# Patient Record
Sex: Male | Born: 1965 | Race: White | Hispanic: No | Marital: Married | Smoking: Current some day smoker
Health system: Western US, Academic
[De-identification: ages and names within clinical notes are randomized; demographics above are authoritative.]

## PROBLEM LIST (undated history)

## (undated) DIAGNOSIS — M1990 Unspecified osteoarthritis, unspecified site: Secondary | ICD-10-CM

## (undated) DIAGNOSIS — R768 Other specified abnormal immunological findings in serum: Secondary | ICD-10-CM

## (undated) DIAGNOSIS — R202 Paresthesia of skin: Secondary | ICD-10-CM

## (undated) DIAGNOSIS — R200 Anesthesia of skin: Secondary | ICD-10-CM

## (undated) DIAGNOSIS — E291 Testicular hypofunction: Secondary | ICD-10-CM

## (undated) DIAGNOSIS — N529 Male erectile dysfunction, unspecified: Secondary | ICD-10-CM

## (undated) DIAGNOSIS — Z8739 Personal history of other diseases of the musculoskeletal system and connective tissue: Secondary | ICD-10-CM

## (undated) DIAGNOSIS — M79606 Pain in leg, unspecified: Secondary | ICD-10-CM

## (undated) DIAGNOSIS — I10 Essential (primary) hypertension: Secondary | ICD-10-CM

## (undated) HISTORY — DX: Male erectile dysfunction, unspecified: N52.9

## (undated) HISTORY — DX: Other specified abnormal immunological findings in serum: R76.8

## (undated) HISTORY — DX: Pain in leg, unspecified: M79.606

## (undated) HISTORY — DX: Testicular hypofunction: E29.1

## (undated) HISTORY — DX: Essential (primary) hypertension: I10

## (undated) HISTORY — DX: Anesthesia of skin: R20.0

## (undated) HISTORY — DX: Unspecified osteoarthritis, unspecified site: M19.90

## (undated) HISTORY — DX: Personal history of other diseases of the musculoskeletal system and connective tissue: Z87.39

## (undated) HISTORY — DX: Paresthesia of skin: R20.2

---

## 2016-06-10 HISTORY — PX: COLONOSCOPY: GILAB00002

## 2016-06-18 DIAGNOSIS — R768 Other specified abnormal immunological findings in serum: Secondary | ICD-10-CM

## 2016-06-18 HISTORY — DX: Other specified abnormal immunological findings in serum: R76.8

## 2016-09-01 ENCOUNTER — Ambulatory Visit: Payer: 59

## 2016-09-01 DIAGNOSIS — B182 Chronic viral hepatitis C: Secondary | ICD-10-CM | POA: Insufficient documentation

## 2016-09-01 LAB — MMC  MANUAL DIFFERENTIAL
Bands %: 5 %
Eosinophils %: 3 %
Eosinophils Abs: 0.2 10*3/uL (ref 0.0–0.2)
Lymphocytes %: 52 %
Lymphocytes Abs: 3.5 10*3/uL — ABNORMAL HIGH (ref 1.3–2.9)
Monocytes %: 12 %
Monocytes Abs: 0.8 10*3/uL (ref 0.3–0.8)
Neutrophil Abs: 2.2 10*3/uL (ref 2.2–4.8)
Polys (Segs) %: 28 %
Sample Size, WBC: 100

## 2016-09-01 LAB — HEPATIC FUNCTION PANEL
Alanine Transferase (ALT): 44 U/L (ref 2–56)
Albumin: 4.3 g/dL (ref 3.2–4.7)
Alkaline Phosphatase (ALP): 44 U/L (ref 38–126)
Aspartate Transaminase (AST): 34 U/L (ref 9–44)
Bilirubin Direct: <0.1 mg/dL (ref <=0.5)
Bilirubin Total: 0.5 mg/dL (ref 0.1–2.2)
Protein: 7.2 g/dL (ref 5.9–8.2)

## 2016-09-01 LAB — CBC WITH DIFFERENTIAL
Hematocrit: 41.9 % (ref 38.0–51.0)
Hemoglobin: 14.6 g/dL (ref 13.0–18.0)
MCH: 31.9 pg (ref 27.0–34.0)
MCHC g/dL: 34.8 g/dL (ref 33.0–37.0)
MCV: 91.7 fL (ref 82.0–99.0)
MPV: 10.6 fL (ref 9.4–12.4)
Platelet Count: 179 10*3/uL (ref 151–365)
RDW: 11.8 % (ref 11.5–14.5)
Red Blood Cell Count: 4.57 10*6/uL (ref 4.10–5.65)
White Blood Cell Count: 6.8 10*3/uL (ref 4.2–10.8)

## 2016-09-05 LAB — MMC HEP C VIRUS BY QUANTITATIVE NAAT SENDOUT
zzzHCV Qnt by PCR (IU/mL): <15 IU/mL
zzzHCV Qnt by PCR (log IU/mL): <1.2 {Log_IU}
zzzHCV Qnt by PCR Interp: NOT DETECTED

## 2016-09-07 ENCOUNTER — Other Ambulatory Visit (HOSPITAL_BASED_OUTPATIENT_CLINIC_OR_DEPARTMENT_OTHER): Payer: Self-pay | Admitting: Family

## 2016-09-07 ENCOUNTER — Other Ambulatory Visit (HOSPITAL_BASED_OUTPATIENT_CLINIC_OR_DEPARTMENT_OTHER): Payer: Self-pay | Admitting: Medical

## 2016-09-09 ENCOUNTER — Other Ambulatory Visit (HOSPITAL_BASED_OUTPATIENT_CLINIC_OR_DEPARTMENT_OTHER): Payer: Self-pay | Admitting: Medical

## 2016-09-10 NOTE — Telephone Encounter (Signed)
Rx done lmovm advising pt ready for PU

## 2016-09-11 ENCOUNTER — Other Ambulatory Visit (HOSPITAL_BASED_OUTPATIENT_CLINIC_OR_DEPARTMENT_OTHER): Payer: Self-pay | Admitting: Family

## 2016-09-11 MED ORDER — CYCLOBENZAPRINE 10 MG TABLET
10.0000 mg | ORAL_TABLET | Freq: Every day | ORAL | 0 refills | Status: DC | PRN
Start: 2016-09-11 — End: 2016-10-22

## 2016-09-11 MED ORDER — ACYCLOVIR 800 MG TABLET
800.0000 mg | ORAL_TABLET | Freq: Every day | ORAL | 0 refills | Status: DC
Start: 2016-09-11 — End: 2016-10-22

## 2016-09-11 NOTE — Telephone Encounter (Signed)
Baldo AshCarl called pts fiance will be picking up his meds he ok'd it instructed her to bring ID

## 2016-09-11 NOTE — Telephone Encounter (Signed)
VO called in to [facility]

## 2016-09-12 NOTE — Telephone Encounter (Signed)
Please close, duplicate order

## 2016-09-21 ENCOUNTER — Encounter (HOSPITAL_BASED_OUTPATIENT_CLINIC_OR_DEPARTMENT_OTHER): Payer: Self-pay | Admitting: Medical

## 2016-09-21 ENCOUNTER — Ambulatory Visit (HOSPITAL_BASED_OUTPATIENT_CLINIC_OR_DEPARTMENT_OTHER): Payer: 59 | Admitting: Medical

## 2016-09-21 VITALS — BP 116/78 | HR 81 | Temp 97.5°F | Resp 14 | Ht 71.58 in | Wt 186.2 lb

## 2016-09-21 DIAGNOSIS — M5136 Other intervertebral disc degeneration, lumbar region: Secondary | ICD-10-CM

## 2016-09-21 DIAGNOSIS — M5126 Other intervertebral disc displacement, lumbar region: Secondary | ICD-10-CM

## 2016-09-21 MED ORDER — HYDROCODONE 10 MG-ACETAMINOPHEN 325 MG TABLET
1.0000 | ORAL_TABLET | Freq: Four times a day (QID) | ORAL | 0 refills | Status: DC | PRN
Start: 2016-09-21 — End: 2016-12-17

## 2016-09-21 NOTE — Progress Notes (Signed)
50 year old male presents today for a med check since he takes Norco twice daily. He has DDD lumbar spine. He just had an epidural which does not last as long as they used to. He takes Tramadol when he is working and [employer] at bedtime or when he is off work. He is interested in discussing surgical options. He sometimes takes muscle relaxants at bedtime when there is spasm noted.     Review of Systems   Constitutional: Negative.    Respiratory: Negative.    Cardiovascular: Negative.    Musculoskeletal: Positive for back pain. Negative for gait problem, joint swelling, neck pain and neck stiffness.   Psychiatric/Behavioral: Negative.      Physical Exam   Constitutional: He appears well-developed and well-nourished.   Musculoskeletal: He exhibits no edema, tenderness or deformity.   Gait somewhat slow but appears normal   Neurological: He is alert.   Psychiatric: He has a normal mood and affect.   Vitals reviewed.    1. DDD (degenerative disc disease), lumbar  3 prescriptions were written and scanned, given to patient. CSA was updated with the higher amount  - ORTHOPEDIC-GENERAL REFERRAL  - Hydrocodone 10 mg/Acetaminophen 325 mg (NORCO 10) per tablet; Take 1 tablet by mouth every 6 hours if needed.  Dispense: 60 tablet; Refill: 0    2. Bulging lumbar disc    - ORTHOPEDIC-GENERAL REFERRAL  - Hydrocodone 10 mg/Acetaminophen 325 mg (NORCO 10) per tablet; Take 1 tablet by mouth every 6 hours if needed.  Dispense: 60 tablet; Refill: 0    Return in 3 months for med check

## 2016-09-22 NOTE — Progress Notes (Signed)
Chart reviewed.

## 2016-10-22 ENCOUNTER — Other Ambulatory Visit (HOSPITAL_BASED_OUTPATIENT_CLINIC_OR_DEPARTMENT_OTHER): Payer: Self-pay | Admitting: Family

## 2016-10-23 ENCOUNTER — Telehealth (HOSPITAL_BASED_OUTPATIENT_CLINIC_OR_DEPARTMENT_OTHER): Payer: Self-pay | Admitting: Family

## 2016-10-23 NOTE — Telephone Encounter (Signed)
Pharmacy instructed to cx carisoprodol. Refill flexeril for pt.

## 2016-10-23 NOTE — Telephone Encounter (Signed)
rx

## 2016-10-23 NOTE — Telephone Encounter (Signed)
[facility]

## 2016-10-27 ENCOUNTER — Telehealth (HOSPITAL_BASED_OUTPATIENT_CLINIC_OR_DEPARTMENT_OTHER): Payer: Self-pay | Admitting: Medical

## 2016-10-27 NOTE — Telephone Encounter (Signed)
Pt wife called in stating that Dr Burman NievesMolitor said there was nothing he could do to help the pt. She would like to know of anybody else who can help him and his symptoms. She is willing to drive him to [place] if needed. Please advise

## 2016-11-04 ENCOUNTER — Telehealth (HOSPITAL_BASED_OUTPATIENT_CLINIC_OR_DEPARTMENT_OTHER): Payer: Self-pay | Admitting: Medical

## 2016-11-04 DIAGNOSIS — F17200 Nicotine dependence, unspecified, uncomplicated: Secondary | ICD-10-CM

## 2016-11-04 MED ORDER — VARENICLINE TARTRATE 0.5 MG (11)-1 MG (42) TABLETS IN A DOSE PACK
ORAL_TABLET | ORAL | 3 refills | Status: DC
Start: 2016-11-04 — End: 2016-12-17

## 2016-11-04 NOTE — Telephone Encounter (Signed)
Sherritin LMOVM he would like a RX for chantix he started smoking again . If ok [facility] [place]

## 2016-11-05 ENCOUNTER — Other Ambulatory Visit (HOSPITAL_BASED_OUTPATIENT_CLINIC_OR_DEPARTMENT_OTHER): Payer: Self-pay | Admitting: Family

## 2016-11-05 NOTE — Telephone Encounter (Signed)
VO for tramadol called in to [facility]

## 2016-11-05 NOTE — Telephone Encounter (Signed)
Pt. Advised ready for PU

## 2016-11-16 ENCOUNTER — Ambulatory Visit: Payer: 59

## 2016-11-16 DIAGNOSIS — B182 Chronic viral hepatitis C: Secondary | ICD-10-CM | POA: Insufficient documentation

## 2016-11-16 LAB — CBC WITH DIFFERENTIAL
Basophils % Auto: 0.9 % (ref 0.0–1.0)
Basophils Abs Auto: 0.1 10*3/uL (ref 0.0–0.1)
Eosinophils % Auto: 3.2 % (ref 0.0–4.0)
Eosinophils Abs Auto: 0.3 10*3/uL — ABNORMAL HIGH (ref 0.0–0.2)
Hematocrit: 41.5 % (ref 38.0–51.0)
Hemoglobin: 14.6 g/dL (ref 13.0–18.0)
Immature Granulocytes % Auto: 0.2 % (ref 0.00–0.50)
Immature Granulocytes Abs Auto: 0 10*3/uL (ref 0.0–0.0)
Lymphocytes % Auto: 39.6 % (ref 5.0–41.0)
Lymphocytes Abs Auto: 3.6 10*3/uL — ABNORMAL HIGH (ref 1.3–2.9)
MCH: 33 pg (ref 27.0–34.0)
MCHC g/dL: 35.2 g/dL (ref 33.0–37.0)
MCV: 93.9 fL (ref 82.0–99.0)
MPV: 11.1 fL (ref 9.4–12.4)
Monocytes % Auto: 9.4 % (ref 0.0–10.0)
Monocytes Abs Auto: 0.9 10*3/uL — ABNORMAL HIGH (ref 0.3–0.8)
Neutrophils % Auto: 46.7 % (ref 45.0–75.0)
Neutrophils Abs Auto: 4.25 10*3/uL (ref 2.20–4.80)
Nucleated Cell Count: 0 10*3/uL (ref 0.0–0.1)
Nucleated RBC/100 WBC: 0 % WBC (ref <=0.0)
Platelet Count: 150 10*3/uL — ABNORMAL LOW (ref 151–365)
RDW: 11.9 % (ref 11.5–14.5)
Red Blood Cell Count: 4.42 10*6/uL (ref 4.10–5.65)
White Blood Cell Count: 9.1 10*3/uL (ref 4.2–10.8)

## 2016-11-16 LAB — HEPATIC FUNCTION PANEL
Alanine Transferase (ALT): 29 U/L (ref 4–56)
Albumin: 4.5 g/dL (ref 3.2–4.7)
Alkaline Phosphatase (ALP): 48 U/L (ref 38–126)
Aspartate Transaminase (AST): 32 U/L (ref 9–44)
Bilirubin Direct: <0.1 mg/dL (ref <=0.5)
Bilirubin Total: 0.3 mg/dL (ref 0.1–2.2)
Protein: 7.2 g/dL (ref 5.9–8.2)

## 2016-11-19 LAB — MMC HEP C VIRUS BY QUANTITATIVE NAAT SENDOUT
zzzHCV Qnt by PCR (IU/mL): <15 IU/mL
zzzHCV Qnt by PCR (log IU/mL): <1.2 {Log_IU}
zzzHCV Qnt by PCR Interp: NOT DETECTED

## 2016-12-10 ENCOUNTER — Other Ambulatory Visit (HOSPITAL_BASED_OUTPATIENT_CLINIC_OR_DEPARTMENT_OTHER): Payer: Self-pay | Admitting: Family

## 2016-12-10 ENCOUNTER — Other Ambulatory Visit (HOSPITAL_BASED_OUTPATIENT_CLINIC_OR_DEPARTMENT_OTHER): Payer: Self-pay | Admitting: Family Medicine

## 2016-12-10 MED ORDER — TRAMADOL 50 MG TABLET
1.0000 | ORAL_TABLET | Freq: Four times a day (QID) | ORAL | 0 refills | Status: AC | PRN
Start: 2016-12-10 — End: 2017-01-09

## 2016-12-10 NOTE — Telephone Encounter (Signed)
rx

## 2016-12-12 ENCOUNTER — Other Ambulatory Visit (HOSPITAL_BASED_OUTPATIENT_CLINIC_OR_DEPARTMENT_OTHER): Payer: Self-pay | Admitting: Medical

## 2016-12-15 NOTE — Telephone Encounter (Signed)
rx

## 2016-12-16 ENCOUNTER — Encounter (HOSPITAL_BASED_OUTPATIENT_CLINIC_OR_DEPARTMENT_OTHER): Payer: Self-pay | Admitting: Medical

## 2016-12-17 ENCOUNTER — Encounter (HOSPITAL_BASED_OUTPATIENT_CLINIC_OR_DEPARTMENT_OTHER): Payer: Self-pay | Admitting: Medical

## 2016-12-17 ENCOUNTER — Ambulatory Visit (HOSPITAL_BASED_OUTPATIENT_CLINIC_OR_DEPARTMENT_OTHER): Payer: 59 | Admitting: NURSE PRACTITIONER

## 2016-12-17 ENCOUNTER — Ambulatory Visit (HOSPITAL_BASED_OUTPATIENT_CLINIC_OR_DEPARTMENT_OTHER): Payer: Self-pay | Admitting: Medical

## 2016-12-17 VITALS — BP 120/84 | HR 62 | Temp 97.0°F | Wt 200.0 lb

## 2016-12-17 DIAGNOSIS — M5136 Other intervertebral disc degeneration, lumbar region: Secondary | ICD-10-CM

## 2016-12-17 DIAGNOSIS — R6882 Decreased libido: Secondary | ICD-10-CM

## 2016-12-17 DIAGNOSIS — M5126 Other intervertebral disc displacement, lumbar region: Secondary | ICD-10-CM

## 2016-12-17 DIAGNOSIS — I7300 Raynaud's syndrome without gangrene: Secondary | ICD-10-CM

## 2016-12-17 DIAGNOSIS — Z23 Encounter for immunization: Secondary | ICD-10-CM

## 2016-12-17 MED ORDER — HYDROCODONE 10 MG-ACETAMINOPHEN 325 MG TABLET
1.0000 | ORAL_TABLET | Freq: Two times a day (BID) | ORAL | 0 refills | Status: AC
Start: 2016-12-17 — End: 2017-01-16

## 2016-12-17 MED ORDER — CARISOPRODOL 350 MG TABLET
350.0000 mg | ORAL_TABLET | Freq: Four times a day (QID) | ORAL | 0 refills | Status: AC
Start: 2016-12-17 — End: 2017-01-16

## 2016-12-17 MED ORDER — ACYCLOVIR 400 MG TABLET
400.0000 mg | ORAL_TABLET | Freq: Two times a day (BID) | ORAL | 0 refills | Status: DC
Start: 2016-12-17 — End: 2017-03-08

## 2016-12-17 NOTE — Progress Notes (Signed)
51 year old male presents today with several issues. He needs his pain medication prescriptions. He is tolerating the medication. A CSA was done last visit.  He tried Flexeril for the back pain and it made him agitated so [facility]. [place] been [place] in [place] and they will see him next month.  He has numbness in both hands and his hands turn white and blue for up to 2 hours then resolves. He does work in the cold. It is off and on.  He has herpes and he had a flare up recently and they were inside and outside his mouth. He would like to try oppressive therapy for a few months since he is stressed with a new job at this time.  He also has no libido. Generic Viagra works but the libido is gone. He would like testosterone testing.  He has been on pain medication for quite some time, no antidepressants and no other risk factors.    Review of Systems   Constitutional: Negative.    HENT: Negative.    Respiratory: Negative.    Cardiovascular: Negative.    Genitourinary:        No libido   Skin:        Periodic oral sores   Neurological: Positive for numbness.        Numbness hands only   Hematological: Bruises/bleeds easily.        Taking Ibuprofen 800's and aspirin   Psychiatric/Behavioral: Negative.      Physical Exam   Constitutional: He appears well-developed and well-nourished. No distress.   HENT:   No oral lesions   Musculoskeletal:   Hands appear normal at this time. Good color and temp   Psychiatric: He has a normal mood and affect.   Vitals reviewed.    1. DDD (degenerative disc disease), lumbar  Follow up with the surgeon    2. Bulging lumbar disc  SOMA has been ordered, continue Hydrocodone as directed. 3 prescriptions written and scanned    3. Low libido  - TESTOSTERONE, TOTAL; Future  - [facility] TESTOSTERONE, FREE; Future    4. Raynaud's disease without gangrene  Printed info was given to patient

## 2016-12-22 ENCOUNTER — Ambulatory Visit: Payer: Self-pay | Attending: Medical

## 2016-12-22 DIAGNOSIS — R6882 Decreased libido: Secondary | ICD-10-CM | POA: Insufficient documentation

## 2016-12-22 DIAGNOSIS — B182 Chronic viral hepatitis C: Secondary | ICD-10-CM | POA: Insufficient documentation

## 2016-12-22 LAB — CBC WITH DIFFERENTIAL
Basophils % Auto: 0.7 % (ref 0.0–1.0)
Basophils Abs Auto: 0.1 10*3/uL (ref 0.0–0.1)
Eosinophils % Auto: 2.4 % (ref 0.0–4.0)
Eosinophils Abs Auto: 0.2 10*3/uL (ref 0.0–0.2)
Hematocrit: 36.5 % — ABNORMAL LOW (ref 38.0–51.0)
Hemoglobin: 12.5 g/dL — ABNORMAL LOW (ref 13.0–18.0)
Immature Granulocytes % Auto: 0.1 % (ref 0.00–0.50)
Immature Granulocytes Abs Auto: 0 10*3/uL (ref 0.0–0.0)
Lymphocytes % Auto: 40.3 % (ref 5.0–41.0)
Lymphocytes Abs Auto: 2.7 10*3/uL (ref 1.3–2.9)
MCH: 32.6 pg (ref 27.0–34.0)
MCHC g/dL: 34.2 g/dL (ref 33.0–37.0)
MCV: 95.1 fL (ref 82.0–99.0)
MPV: 10.8 fL (ref 9.4–12.4)
Monocytes % Auto: 11.1 % — ABNORMAL HIGH (ref 0.0–10.0)
Monocytes Abs Auto: 0.8 10*3/uL (ref 0.3–0.8)
Neutrophils % Auto: 45.4 % (ref 45.0–75.0)
Neutrophils Abs Auto: 3.07 10*3/uL (ref 2.20–4.80)
Nucleated Cell Count: 0 10*3/uL (ref 0.0–0.1)
Nucleated RBC/100 WBC: 0 % WBC (ref <=0.0)
Platelet Count: 146 10*3/uL — ABNORMAL LOW (ref 151–365)
RDW: 11.9 % (ref 11.5–14.5)
Red Blood Cell Count: 3.84 10*6/uL — ABNORMAL LOW (ref 4.10–5.65)
White Blood Cell Count: 6.8 10*3/uL (ref 4.2–10.8)

## 2016-12-22 LAB — TESTOSTERONE, TOTAL: Testosterone, Total: 163 ng/dL — ABNORMAL LOW (ref 175–781)

## 2016-12-22 LAB — HEPATIC FUNCTION PANEL
Alanine Transferase (ALT): 27 U/L (ref 4–56)
Albumin: 4.2 g/dL (ref 3.2–4.7)
Alkaline Phosphatase (ALP): 42 U/L (ref 38–126)
Aspartate Transaminase (AST): 31 U/L (ref 9–44)
Bilirubin Direct: <0.1 mg/dL (ref <=0.5)
Bilirubin Total: 0.3 mg/dL (ref 0.1–2.2)
Protein: 6.9 g/dL (ref 5.9–8.2)

## 2016-12-24 LAB — MMC HEP C VIRUS BY QUANTITATIVE NAAT SENDOUT
zzzHCV Qnt by PCR (IU/mL): <15 IU/mL
zzzHCV Qnt by PCR (log IU/mL): <1.2 {Log_IU}
zzzHCV Qnt by PCR Interp: NOT DETECTED

## 2016-12-25 LAB — MMC TESTOSTERONE, FREE SENDOUT: Testosterone, Free: 24 pg/mL — ABNORMAL LOW (ref 47–244)

## 2017-01-10 ENCOUNTER — Other Ambulatory Visit (HOSPITAL_BASED_OUTPATIENT_CLINIC_OR_DEPARTMENT_OTHER): Payer: Self-pay | Admitting: Family Medicine

## 2017-01-11 NOTE — Telephone Encounter (Signed)
VO for tramadol called in to [facility]

## 2017-01-11 NOTE — Telephone Encounter (Signed)
rx

## 2017-01-19 ENCOUNTER — Telehealth (HOSPITAL_BASED_OUTPATIENT_CLINIC_OR_DEPARTMENT_OTHER): Payer: Self-pay | Admitting: Medical

## 2017-01-19 NOTE — Telephone Encounter (Signed)
Lmovm requesting new RX for testosterone 25 mg ~ 2.5 grams  #30 packets. Insurance will cover it this way .

## 2017-01-20 NOTE — Telephone Encounter (Signed)
Advise pt a written script is here for pick up. Please scan in

## 2017-01-21 NOTE — Telephone Encounter (Signed)
Joe Roberts advised ready for PU

## 2017-02-24 ENCOUNTER — Other Ambulatory Visit (HOSPITAL_BASED_OUTPATIENT_CLINIC_OR_DEPARTMENT_OTHER): Payer: Self-pay | Admitting: Family

## 2017-02-24 NOTE — Telephone Encounter (Signed)
rx called into [facility]

## 2017-02-24 NOTE — Telephone Encounter (Signed)
MT - OK to fill

## 2017-02-24 NOTE — Telephone Encounter (Signed)
Sent to [facility]

## 2017-03-04 ENCOUNTER — Other Ambulatory Visit (HOSPITAL_BASED_OUTPATIENT_CLINIC_OR_DEPARTMENT_OTHER): Payer: Self-pay | Admitting: Medical

## 2017-03-04 MED ORDER — HYDROCODONE 10 MG-ACETAMINOPHEN 325 MG TABLET
1.0000 | ORAL_TABLET | Freq: Three times a day (TID) | ORAL | 0 refills | Status: AC | PRN
Start: 2017-03-04 — End: 2017-05-03

## 2017-03-04 NOTE — Telephone Encounter (Signed)
LFD 12-17-16 ~ Pt.  Has a lap in insurance will schedule pain visit in July. Requesting 2 refills until pain visit.

## 2017-03-08 ENCOUNTER — Other Ambulatory Visit (HOSPITAL_BASED_OUTPATIENT_CLINIC_OR_DEPARTMENT_OTHER): Payer: Self-pay | Admitting: Medical

## 2017-03-09 NOTE — Telephone Encounter (Signed)
Rx

## 2017-03-30 ENCOUNTER — Other Ambulatory Visit (HOSPITAL_BASED_OUTPATIENT_CLINIC_OR_DEPARTMENT_OTHER): Payer: Self-pay | Admitting: Family

## 2017-03-31 NOTE — Telephone Encounter (Signed)
VO for tramadol called in to [facility]

## 2017-05-05 ENCOUNTER — Encounter (HOSPITAL_BASED_OUTPATIENT_CLINIC_OR_DEPARTMENT_OTHER): Payer: Self-pay | Admitting: Medical

## 2017-05-05 ENCOUNTER — Other Ambulatory Visit (HOSPITAL_BASED_OUTPATIENT_CLINIC_OR_DEPARTMENT_OTHER): Payer: Self-pay | Admitting: Family Medicine

## 2017-05-05 ENCOUNTER — Ambulatory Visit (HOSPITAL_BASED_OUTPATIENT_CLINIC_OR_DEPARTMENT_OTHER): Payer: 59 | Admitting: Medical

## 2017-05-05 VITALS — BP 132/80 | HR 58 | Temp 96.9°F | Resp 12 | Wt 196.0 lb

## 2017-05-05 DIAGNOSIS — M5136 Other intervertebral disc degeneration, lumbar region: Secondary | ICD-10-CM

## 2017-05-05 DIAGNOSIS — M5126 Other intervertebral disc displacement, lumbar region: Secondary | ICD-10-CM

## 2017-05-05 DIAGNOSIS — Z13220 Encounter for screening for lipoid disorders: Secondary | ICD-10-CM

## 2017-05-05 DIAGNOSIS — Z125 Encounter for screening for malignant neoplasm of prostate: Secondary | ICD-10-CM

## 2017-05-05 DIAGNOSIS — M5430 Sciatica, unspecified side: Secondary | ICD-10-CM

## 2017-05-05 DIAGNOSIS — Z131 Encounter for screening for diabetes mellitus: Secondary | ICD-10-CM

## 2017-05-05 MED ORDER — CARISOPRODOL 350 MG TABLET
350.0000 mg | ORAL_TABLET | Freq: Every day | ORAL | 0 refills | Status: AC | PRN
Start: 2017-05-05 — End: 2017-06-04

## 2017-05-05 MED ORDER — TRAMADOL 50 MG TABLET
ORAL_TABLET | ORAL | 0 refills | Status: AC
Start: 2017-05-05 — End: 2017-06-05

## 2017-05-05 MED ORDER — HYDROCODONE 10 MG-ACETAMINOPHEN 325 MG TABLET
ORAL_TABLET | ORAL | 0 refills | Status: DC
Start: 2017-05-05 — End: 2017-05-05

## 2017-05-05 MED ORDER — GABAPENTIN 300 MG CAPSULE
300.0000 mg | ORAL_CAPSULE | Freq: Three times a day (TID) | ORAL | 11 refills | Status: DC
Start: 2017-05-05 — End: 2018-06-23

## 2017-05-05 MED ORDER — IBUPROFEN 800 MG TABLET
800.0000 mg | ORAL_TABLET | Freq: Four times a day (QID) | ORAL | 3 refills | Status: DC | PRN
Start: 2017-05-05 — End: 2017-09-02

## 2017-05-05 NOTE — Progress Notes (Signed)
51 year old male presents today with his wife for a chronic pain visit. He has an appointment for a consult with Dr Alda Bertholdricek August 29. His pain is 7-8/10 daily. He works all day as a Psychologist, occupationalwelder bent over all day. He had an epidural which has helped and he can get one more now. Another referral is to be done.He has been taking Norco 10/325 twice daily #60/month. He takes Tramadol 2 in the morning and 2 12 hours later #120. This is better tolerated than Norco when driving or working. He does not take the Tramadol and Norco at the same times daily. They are aware of the dangers of mixing medications. SOMA does help with spasming at bedtime. Again he does not take SOMA with pain medication. He uses Motrin sparingly as it does make him bleed with minimal cuts. Sciatica is the main issue with his pain. It radiates down his left leg to mid calf and is worse during the daytime. He had been on Gabapentin in the past which helped the sciatica greatly. He only uses this when he is at home or on weekends when he is not going anywhere. Constipation is an issue but increasing greens has helped. They are aware of the side effects of pain medications. We had a long discussion about interactions of pain medications, muscle relaxants, NSAIDs, Gabapentin.   Since he tested + for Hep C and has been going through treatment, they would like further testing. He might have been hypoglycemic in the past and they want him rechecked. Cholesterol has not been checked in a few years. They want a PSA even though he was tested in June 2017 and it was within range.     Review of Systems   Constitutional: Negative.  Negative for activity change, chills, fatigue and fever.   Respiratory: Negative.  Negative for cough, chest tightness, shortness of breath and wheezing.    Cardiovascular: Negative.  Negative for chest pain, palpitations and leg swelling.   Gastrointestinal: Positive for constipation. Negative for abdominal pain, diarrhea, nausea and  vomiting.   Endocrine: Negative.    Genitourinary: Negative.  Negative for difficulty urinating, frequency and urgency.   Musculoskeletal: Positive for arthralgias, back pain and gait problem.   Skin: Negative.    Neurological: Negative for dizziness, weakness, numbness and headaches.   Hematological: Bruises/bleeds easily.   Psychiatric/Behavioral: Negative.      Physical Exam   Constitutional: He is oriented to person, place, and time. He appears well-developed and well-nourished. No distress.   Musculoskeletal:   He gets up slowly and has to straighten out slowly   Neurological: He is alert and oriented to person, place, and time. No cranial nerve deficit.   Psychiatric: He has a normal mood and affect.   Vitals reviewed.    1. DDD (degenerative disc disease), lumbar  - Ibuprofen (MOTRIN) 800 mg Tablet; Take 1 tablet by mouth every 6 hours if needed. take with food  Dispense: 60 tablet; Refill: 3  - Tramadol (ULTRAM) 50 mg Tablet; Take two twice daily as directed  Dispense: 120 tablet; Refill: 0  - Carisoprodol (SOMA) 350 mg Tablet; Take 1 tablet by mouth every day at bedtime if needed.  Dispense: 30 tablet; Refill: 0  - MISCELLANEOUS REFERRAL to Dr Oren BracketJarka for epidural. CSA is up to date but will be updated next visit if SOMA and Tramadol are to be continued    2. Bulging lumbar disc  - Ibuprofen (MOTRIN) 800 mg Tablet; Take 1 tablet  by mouth every 6 hours if needed. take with food  Dispense: 60 tablet; Refill: 3  - Tramadol (ULTRAM) 50 mg Tablet; Take two twice daily as directed  Dispense: 120 tablet; Refill: 0  - Carisoprodol (SOMA) 350 mg Tablet; Take 1 tablet by mouth every day at bedtime if needed.  Dispense: 30 tablet; Refill: 0  - MISCELLANEOUS REFERRAL  A long discussion about abuse of pain medication, medication interaction, respiratory depression and death, long term issues was done. If an epidural is done, decrease the Tramadol, Norco, Motrin, Gabapentin. Large amounts of opiods and other controlled  medications is not our goal and we want to taper off medication rather than increase the amounts. A referral to pain management may be done at the next visit depending on what Dr Alda Berthold says.    3. Sciatica, unspecified laterality  - Gabapentin (NEURONTIN) 300 mg Capsule; Take 1 capsule by mouth 3 times daily.  Dispense: 90 capsule; Refill: 11  - MISCELLANEOUS REFERRAL  We discussed the medication again and to start with one at bedtime, then if tolerated as discussed, can take one in the morning and increase as needed.    4. Screening for lipid disorders  - LIPID PANEL; Future    5. Screening for diabetes mellitus  - COMPREHENSIVE METABOLIC PANEL; Future    6. Screening for prostate cancer  - PSA SCREEN; Future

## 2017-05-05 NOTE — Patient Instructions (Signed)
Call Dr Oren BracketJarka for another epidural.

## 2017-05-30 ENCOUNTER — Other Ambulatory Visit: Attending: Medical

## 2017-05-30 DIAGNOSIS — Z13220 Encounter for screening for lipoid disorders: Secondary | ICD-10-CM

## 2017-05-30 DIAGNOSIS — Z125 Encounter for screening for malignant neoplasm of prostate: Secondary | ICD-10-CM

## 2017-05-30 DIAGNOSIS — Z131 Encounter for screening for diabetes mellitus: Secondary | ICD-10-CM

## 2017-05-30 LAB — COMPREHENSIVE METABOLIC PANEL
Alanine Transferase (ALT): 26 U/L (ref 4–56)
Alb/Glob Ratio: 1.5 (ref 1.0–1.6)
Albumin: 4.3 g/dL (ref 3.2–4.7)
Alkaline Phosphatase (ALP): 47 U/L (ref 38–126)
Aspartate Transaminase (AST): 28 U/L (ref 9–44)
BUN/ Creatinine: 23.8 — ABNORMAL HIGH (ref 7.3–21.7)
Bilirubin Total: 0.7 mg/dL (ref 0.1–2.2)
Calcium: 9.4 mg/dL (ref 8.7–10.2)
Carbon Dioxide Total: 22 mmol/L (ref 22–32)
Chloride: 104 mmol/L (ref 99–109)
Creatinine Serum: 0.84 mg/dL (ref 0.50–1.30)
E-GFR, Non-African American: 95 mL/min/{1.73_m2} (ref >60)
E-GFR: 95 mL/min/{1.73_m2} (ref >60)
Globulin: 2.9 g/dL (ref 2.2–4.2)
Glucose: 91 mg/dL (ref 70–99)
Potassium: 4.1 mmol/L (ref 3.5–5.2)
Protein: 7.2 g/dL (ref 5.9–8.2)
Sodium: 136 mmol/L (ref 134–143)
Urea Nitrogen, Blood (BUN): 20 mg/dL (ref 6–21)

## 2017-05-30 LAB — LIPID PANEL
Cholesterol: 173 mg/dL (ref 112–200)
HDL Cholesterol: 46 mg/dL (ref >=40)
LDL Cholesterol Calculation: 109 mg/dL — ABNORMAL HIGH (ref <100)
Non-HDL Cholesterol: 127 mg/dL (ref <=160.0)
Total Cholesterol: HDL Ratio: 3.8 mg/dL (ref 2.0–5.0)
Triglyceride: 88 mg/dL (ref 30–150)

## 2017-05-30 LAB — PSA SCREEN: PSA Screen: 0.38 ng/mL (ref 0.10–4.00)

## 2017-05-31 ENCOUNTER — Other Ambulatory Visit: Payer: 59 | Attending: Medical

## 2017-05-31 DIAGNOSIS — B182 Chronic viral hepatitis C: Secondary | ICD-10-CM | POA: Insufficient documentation

## 2017-06-02 LAB — MMC HEP C VIRUS BY QUANTITATIVE NAAT SENDOUT
zzzHCV Qnt by PCR (IU/mL): <15 IU/mL
zzzHCV Qnt by PCR (log IU/mL): <1.2 {Log_IU}
zzzHCV Qnt by PCR Interp: NOT DETECTED

## 2017-06-03 ENCOUNTER — Other Ambulatory Visit (HOSPITAL_BASED_OUTPATIENT_CLINIC_OR_DEPARTMENT_OTHER): Payer: Self-pay | Admitting: Medical

## 2017-06-03 ENCOUNTER — Encounter (HOSPITAL_BASED_OUTPATIENT_CLINIC_OR_DEPARTMENT_OTHER): Payer: Self-pay | Admitting: Medical

## 2017-06-03 MED ORDER — TESTOSTERONE 1 % (50 MG/5 GRAM) TRANSDERMAL GEL PACKET
1.0000 | Freq: Every day | TRANSDERMAL | 0 refills | Status: DC
Start: 2017-06-03 — End: 2018-06-23

## 2017-06-03 NOTE — Telephone Encounter (Signed)
To LH to sign

## 2017-06-06 ENCOUNTER — Other Ambulatory Visit (HOSPITAL_BASED_OUTPATIENT_CLINIC_OR_DEPARTMENT_OTHER): Payer: Self-pay | Admitting: Medical

## 2017-06-07 ENCOUNTER — Telehealth (HOSPITAL_BASED_OUTPATIENT_CLINIC_OR_DEPARTMENT_OTHER): Payer: Self-pay | Admitting: Medical

## 2017-06-07 DIAGNOSIS — M5440 Lumbago with sciatica, unspecified side: Secondary | ICD-10-CM

## 2017-06-07 NOTE — Telephone Encounter (Signed)
Lawson FiscalLori, Can you put in a new referral for this? Last one was from January. Patient was recently seen. I will find a spine surgeon in network for the patient

## 2017-06-07 NOTE — Telephone Encounter (Signed)
lmovm requesting to get a gap exemption to see Dr. Benn Moulderrisek out of network for his back surgery.  Healthcomp # [phone]

## 2017-06-07 NOTE — Telephone Encounter (Signed)
VO for tramadol called in to [facility]

## 2017-06-09 ENCOUNTER — Other Ambulatory Visit (HOSPITAL_BASED_OUTPATIENT_CLINIC_OR_DEPARTMENT_OTHER): Payer: Self-pay | Admitting: Medical

## 2017-06-09 NOTE — Telephone Encounter (Signed)
VO for carisoprodol called [facility] to [facility].

## 2017-06-10 ENCOUNTER — Other Ambulatory Visit (HOSPITAL_BASED_OUTPATIENT_CLINIC_OR_DEPARTMENT_OTHER): Payer: Self-pay | Admitting: Medical

## 2017-06-10 NOTE — Telephone Encounter (Signed)
To LH to sign ~ LFD  03-04-17

## 2017-06-14 MED ORDER — HYDROCODONE 10 MG-ACETAMINOPHEN 325 MG TABLET
1.0000 | ORAL_TABLET | Freq: Three times a day (TID) | ORAL | 0 refills | Status: AC | PRN
Start: 2017-06-14 — End: 2017-07-14

## 2017-06-14 NOTE — Telephone Encounter (Signed)
Written, scanned

## 2017-06-14 NOTE — Telephone Encounter (Signed)
3 prescriptions written, scanned and are ready for pick up, CURES reviewed

## 2017-06-17 ENCOUNTER — Telehealth (HOSPITAL_BASED_OUTPATIENT_CLINIC_OR_DEPARTMENT_OTHER): Payer: Self-pay | Admitting: Medical

## 2017-06-17 DIAGNOSIS — E291 Testicular hypofunction: Secondary | ICD-10-CM

## 2017-06-17 NOTE — Telephone Encounter (Signed)
Please order labs for testosterone levels insurance had denied prior auth for testo~ they require two labs with low levels to approve medication.

## 2017-06-21 NOTE — Telephone Encounter (Signed)
lmovm advising labs ordered .

## 2017-06-23 ENCOUNTER — Telehealth (HOSPITAL_BASED_OUTPATIENT_CLINIC_OR_DEPARTMENT_OTHER): Payer: Self-pay | Admitting: Medical

## 2017-06-23 NOTE — Telephone Encounter (Signed)
lmovm requesting to have new Rx for norco . Insurance will not cover #90  If quanity is BID. ~ [facility]

## 2017-06-23 NOTE — Telephone Encounter (Signed)
Noted pts wife advised ready for PU, will drop off old RX

## 2017-06-23 NOTE — Telephone Encounter (Signed)
Three RX's written for #90 t.i.d.

## 2017-07-09 ENCOUNTER — Other Ambulatory Visit (HOSPITAL_BASED_OUTPATIENT_CLINIC_OR_DEPARTMENT_OTHER): Payer: Self-pay | Admitting: Family

## 2017-07-09 ENCOUNTER — Other Ambulatory Visit (HOSPITAL_BASED_OUTPATIENT_CLINIC_OR_DEPARTMENT_OTHER): Payer: Self-pay | Admitting: Family Medicine

## 2017-07-09 NOTE — Telephone Encounter (Signed)
VO for tramadol and carisoprodol called in to [facility]

## 2017-08-08 ENCOUNTER — Other Ambulatory Visit (HOSPITAL_BASED_OUTPATIENT_CLINIC_OR_DEPARTMENT_OTHER): Payer: Self-pay | Admitting: Family

## 2017-08-08 DIAGNOSIS — G8929 Other chronic pain: Secondary | ICD-10-CM

## 2017-08-08 DIAGNOSIS — M5441 Lumbago with sciatica, right side: Principal | ICD-10-CM

## 2017-08-08 DIAGNOSIS — M5442 Lumbago with sciatica, left side: Principal | ICD-10-CM

## 2017-08-09 NOTE — Telephone Encounter (Signed)
Carisoprodol - Schedule IV controlled medication    Forward to PCP

## 2017-08-10 ENCOUNTER — Other Ambulatory Visit (HOSPITAL_BASED_OUTPATIENT_CLINIC_OR_DEPARTMENT_OTHER): Payer: Self-pay | Admitting: Medical

## 2017-08-10 NOTE — Telephone Encounter (Signed)
LFD:9/14 LVD: 05/05/17- pt has been waiting on this since the weekend please advise

## 2017-08-12 NOTE — Telephone Encounter (Signed)
VO for carisoprodol called in to [facility]. Please call the pt to schedule a CSV w/LH before next refill in Nov. Thanks

## 2017-08-12 NOTE — Telephone Encounter (Signed)
OK to order 

## 2017-08-12 NOTE — Telephone Encounter (Signed)
Yes please fill and they need a CSV appt soon, thanks

## 2017-08-12 NOTE — Telephone Encounter (Signed)
Done

## 2017-08-12 NOTE — Telephone Encounter (Signed)
Please advise if ok to fill. 2 phone calls from pt's wife. Fill with a f/u CSV?

## 2017-08-16 NOTE — Telephone Encounter (Signed)
Noted  

## 2017-08-16 NOTE — Telephone Encounter (Signed)
appt made

## 2017-08-18 NOTE — Telephone Encounter (Signed)
Done please sign off RX

## 2017-08-29 ENCOUNTER — Other Ambulatory Visit (HOSPITAL_BASED_OUTPATIENT_CLINIC_OR_DEPARTMENT_OTHER): Payer: Self-pay | Admitting: Family Medicine

## 2017-09-02 ENCOUNTER — Other Ambulatory Visit (HOSPITAL_BASED_OUTPATIENT_CLINIC_OR_DEPARTMENT_OTHER): Payer: Self-pay | Admitting: Family

## 2017-09-02 ENCOUNTER — Other Ambulatory Visit (HOSPITAL_BASED_OUTPATIENT_CLINIC_OR_DEPARTMENT_OTHER): Payer: Self-pay | Admitting: Medical

## 2017-09-02 DIAGNOSIS — M5136 Other intervertebral disc degeneration, lumbar region: Secondary | ICD-10-CM

## 2017-09-02 DIAGNOSIS — M5126 Other intervertebral disc displacement, lumbar region: Secondary | ICD-10-CM

## 2017-09-02 NOTE — Telephone Encounter (Signed)
VO for tramadol called in to [facility]

## 2017-09-02 NOTE — Telephone Encounter (Signed)
Ok to fill, CURES run

## 2017-09-02 NOTE — Telephone Encounter (Signed)
Please advise. Needs a CURES run. Thanks

## 2017-09-06 ENCOUNTER — Ambulatory Visit (HOSPITAL_BASED_OUTPATIENT_CLINIC_OR_DEPARTMENT_OTHER): Payer: Self-pay | Admitting: Medical

## 2017-09-06 ENCOUNTER — Encounter (HOSPITAL_BASED_OUTPATIENT_CLINIC_OR_DEPARTMENT_OTHER): Payer: Self-pay | Admitting: Medical

## 2017-09-06 VITALS — BP 130/80 | HR 76 | Temp 97.6°F | Resp 14 | Wt 193.0 lb

## 2017-09-06 DIAGNOSIS — Z23 Encounter for immunization: Secondary | ICD-10-CM

## 2017-09-06 DIAGNOSIS — M5126 Other intervertebral disc displacement, lumbar region: Secondary | ICD-10-CM

## 2017-09-06 DIAGNOSIS — L570 Actinic keratosis: Secondary | ICD-10-CM

## 2017-09-06 DIAGNOSIS — M5136 Other intervertebral disc degeneration, lumbar region: Secondary | ICD-10-CM

## 2017-09-06 DIAGNOSIS — F17200 Nicotine dependence, unspecified, uncomplicated: Secondary | ICD-10-CM

## 2017-09-06 DIAGNOSIS — E291 Testicular hypofunction: Secondary | ICD-10-CM

## 2017-09-06 NOTE — Progress Notes (Signed)
51 year old male presents today with his wife with a couple issues. He is seeing a back specialist this week to determine if surgery is a consideration for him. He takes Flexeril but is not needing it yet.  He has a lersion on his nose for 1-1 1/2 years but he keeps picking at it. It will bleed but he wants it removed.  He will be having his testosterone tested tomorrow.  He has restarted smoking and is wondering about Chantix as he has been on it in the past. He has depression in his past.    Review of Systems   Musculoskeletal: Positive for back pain.   Skin:        Lesion on nose   Neurological: Positive for weakness and numbness.   Psychiatric/Behavioral: Negative.      Physical Exam   Constitutional: He is oriented to person, place, and time. He appears well-developed and well-nourished. No distress.   Neurological: He is alert and oriented to person, place, and time.   Skin:   Scaly 4 mm raised lesion on the left nares   Psychiatric: He has a normal mood and affect.   Vitals reviewed.    1. DDD (degenerative disc disease), lumbar  Follow up with Dr Zannie CoveSchott    2. Bulging lumbar disc  Follow up in 3 months for a CSV    3. Smoker  Hold on the Chantix until after surgery and healing due to the depression issues that can happen. Nicoderm patches were given    4. Hypogonadism male  We will call with results of the testing    5. Actinic keratosis  Cryo was done to the lesion, side effects discussed, after care discussed

## 2017-09-13 ENCOUNTER — Other Ambulatory Visit (HOSPITAL_BASED_OUTPATIENT_CLINIC_OR_DEPARTMENT_OTHER): Payer: Self-pay | Admitting: Medical

## 2017-09-13 NOTE — Telephone Encounter (Signed)
VO for carisoprodol called [facility] to [facility].

## 2017-09-22 ENCOUNTER — Other Ambulatory Visit (HOSPITAL_BASED_OUTPATIENT_CLINIC_OR_DEPARTMENT_OTHER): Payer: Self-pay | Admitting: Medical

## 2017-09-22 MED ORDER — HYDROCODONE 10 MG-ACETAMINOPHEN 325 MG TABLET
1.0000 | ORAL_TABLET | Freq: Three times a day (TID) | ORAL | 0 refills | Status: AC | PRN
Start: 2017-09-22 — End: 2017-10-22

## 2017-09-22 NOTE — Telephone Encounter (Signed)
3 scripts written, scanned and ready to pick up

## 2017-09-22 NOTE — Telephone Encounter (Signed)
LFD 06-23-17 ~ cures11 12-18

## 2017-09-23 NOTE — Telephone Encounter (Signed)
Done

## 2017-10-04 ENCOUNTER — Other Ambulatory Visit (HOSPITAL_BASED_OUTPATIENT_CLINIC_OR_DEPARTMENT_OTHER): Payer: Self-pay | Admitting: Family

## 2017-10-04 DIAGNOSIS — M5136 Other intervertebral disc degeneration, lumbar region: Secondary | ICD-10-CM

## 2017-10-04 DIAGNOSIS — M5126 Other intervertebral disc displacement, lumbar region: Secondary | ICD-10-CM

## 2017-10-21 ENCOUNTER — Other Ambulatory Visit (HOSPITAL_BASED_OUTPATIENT_CLINIC_OR_DEPARTMENT_OTHER): Payer: Self-pay | Admitting: Medical

## 2017-10-21 NOTE — Telephone Encounter (Signed)
VO for carisoprodol called [facility] to [facility].

## 2017-10-22 ENCOUNTER — Other Ambulatory Visit (HOSPITAL_BASED_OUTPATIENT_CLINIC_OR_DEPARTMENT_OTHER): Payer: Self-pay | Admitting: Family

## 2017-10-22 DIAGNOSIS — M5126 Other intervertebral disc displacement, lumbar region: Secondary | ICD-10-CM

## 2017-10-22 DIAGNOSIS — M5136 Other intervertebral disc degeneration, lumbar region: Secondary | ICD-10-CM

## 2017-10-22 NOTE — Telephone Encounter (Signed)
Ok for #60 

## 2017-10-22 NOTE — Telephone Encounter (Signed)
VO for tramadol called in to [facility]

## 2017-10-22 NOTE — Telephone Encounter (Signed)
Awaiting surgery. Last CURES was in Nov. Please advise, as LH is out. Thanks

## 2017-10-22 NOTE — Telephone Encounter (Signed)
Spoke w/the pt. Takes the tramadol while at work. Takes the Norco, if needed, when he's back at home. Gets home from work around 3:30 pm. May take a Norco at that time, and then [facility]. Pt also stated that he's having surgery next month and plans to not be on any of these medications post op.

## 2017-10-22 NOTE — Telephone Encounter (Signed)
Why is he switching back and forth with hydrocodone? Also he should not use carisoprodol with either tramadol or hydrocodone..Marland Kitchen

## 2017-10-24 ENCOUNTER — Other Ambulatory Visit (HOSPITAL_BASED_OUTPATIENT_CLINIC_OR_DEPARTMENT_OTHER): Payer: Self-pay | Admitting: Family

## 2017-11-01 ENCOUNTER — Encounter (HOSPITAL_BASED_OUTPATIENT_CLINIC_OR_DEPARTMENT_OTHER): Payer: Self-pay | Admitting: Medical

## 2017-11-01 ENCOUNTER — Ambulatory Visit (HOSPITAL_BASED_OUTPATIENT_CLINIC_OR_DEPARTMENT_OTHER): Payer: Self-pay | Admitting: Medical

## 2017-11-01 ENCOUNTER — Ambulatory Visit: Payer: 59 | Attending: Medical

## 2017-11-01 VITALS — BP 130/80 | HR 45 | Temp 97.6°F | Resp 12 | Wt 200.0 lb

## 2017-11-01 DIAGNOSIS — M533 Sacrococcygeal disorders, not elsewhere classified: Secondary | ICD-10-CM

## 2017-11-01 DIAGNOSIS — E291 Testicular hypofunction: Secondary | ICD-10-CM | POA: Insufficient documentation

## 2017-11-01 DIAGNOSIS — Z01818 Encounter for other preprocedural examination: Secondary | ICD-10-CM

## 2017-11-01 DIAGNOSIS — M5136 Other intervertebral disc degeneration, lumbar region: Secondary | ICD-10-CM

## 2017-11-01 DIAGNOSIS — G8929 Other chronic pain: Secondary | ICD-10-CM

## 2017-11-01 DIAGNOSIS — Z01812 Encounter for preprocedural laboratory examination: Secondary | ICD-10-CM

## 2017-11-01 DIAGNOSIS — M5126 Other intervertebral disc displacement, lumbar region: Secondary | ICD-10-CM

## 2017-11-01 DIAGNOSIS — M5440 Lumbago with sciatica, unspecified side: Secondary | ICD-10-CM

## 2017-11-01 LAB — APTT STUDIES
APTT Ratio: 0.9 (ref 0.8–1.2)
aPTT: 29.6 secs (ref 24.0–37.0)

## 2017-11-01 LAB — TESTOSTERONE, TOTAL: Testosterone, Total: 197 ng/dL (ref 175–781)

## 2017-11-01 LAB — CBC WITH DIFFERENTIAL
Basophils % Auto: 0.7 % (ref 0.0–1.0)
Basophils Abs Auto: 0.1 10*3/uL (ref 0.0–0.1)
Eosinophils % Auto: 1.6 % (ref 0.0–4.0)
Eosinophils Abs Auto: 0.1 10*3/uL (ref 0.0–0.2)
Hematocrit: 39.5 % (ref 38.0–51.0)
Hemoglobin: 13.4 g/dL (ref 13.0–18.0)
Immature Granulocytes % Auto: 0.2 % (ref 0.00–0.50)
Immature Granulocytes Abs Auto: 0 10*3/uL (ref 0.0–0.0)
Lymphocytes % Auto: 36.3 % (ref 5.0–41.0)
Lymphocytes Abs Auto: 3 10*3/uL — ABNORMAL HIGH (ref 1.3–2.9)
MCH: 32.3 pg (ref 27.0–34.0)
MCHC g/dL: 33.9 g/dL (ref 33.0–37.0)
MCV: 95.2 fL (ref 82.0–99.0)
MPV: 10.6 fL (ref 9.4–12.4)
Monocytes % Auto: 11.9 % — ABNORMAL HIGH (ref 0.0–10.0)
Monocytes Abs Auto: 1 10*3/uL — ABNORMAL HIGH (ref 0.3–0.8)
Neutrophils % Auto: 49.3 % (ref 45.0–75.0)
Neutrophils Abs Auto: 4.08 10*3/uL (ref 2.20–4.80)
Nucleated Cell Count: 0 10*3/uL (ref 0.0–0.1)
Nucleated RBC/100 WBC: 0 % WBC (ref <=0.0)
Platelet Count: 166 10*3/uL (ref 151–365)
RDW: 11.9 % (ref 11.5–14.5)
Red Blood Cell Count: 4.15 10*6/uL (ref 4.10–5.65)
White Blood Cell Count: 8.3 10*3/uL (ref 4.2–10.8)

## 2017-11-01 LAB — COMPREHENSIVE METABOLIC PANEL
Alanine Transferase (ALT): 33 U/L (ref 4–56)
Alb/Glob Ratio: 1.8 — ABNORMAL HIGH (ref 1.0–1.6)
Albumin: 4.4 g/dL (ref 3.2–4.7)
Alkaline Phosphatase (ALP): 51 U/L (ref 38–126)
Aspartate Transaminase (AST): 31 U/L (ref 9–44)
BUN/ Creatinine: 25 — ABNORMAL HIGH (ref 7.3–21.7)
Bilirubin Total: 0.3 mg/dL (ref 0.1–2.2)
Calcium: 9.1 mg/dL (ref 8.7–10.2)
Carbon Dioxide Total: 26 mmol/L (ref 22–32)
Chloride: 103 mmol/L (ref 99–109)
Creatinine Serum: 0.92 mg/dL (ref 0.50–1.30)
E-GFR, Non-African American: 86 mL/min/{1.73_m2} (ref >60)
E-GFR: 86 mL/min/{1.73_m2} (ref >60)
Globulin: 2.4 g/dL (ref 2.2–4.2)
Glucose: 101 mg/dL — ABNORMAL HIGH (ref 70–99)
Potassium: 4.8 mmol/L (ref 3.5–5.2)
Protein: 6.8 g/dL (ref 5.9–8.2)
Sodium: 137 mmol/L (ref 134–143)
Urea Nitrogen, Blood (BUN): 23 mg/dL — ABNORMAL HIGH (ref 6–21)

## 2017-11-01 LAB — URINALYSIS AND CULTURE IF IND
Bilirubin: NEGATIVE
Glucose: NEGATIVE mg/dL
Ketones: NEGATIVE
Leukocyte Esterase: NEGATIVE
Nitrite: NEGATIVE
Occult Blood: NEGATIVE
Protein: NEGATIVE mg/dL
Specific Gravity: 1.028 (ref 1.002–1.030)
Urobilinogen: NEGATIVE mg/dL
pH Urine: 5 (ref 5.0–9.0)

## 2017-11-01 LAB — MMC PROTHROMBIN TIME/INR
INR: 0.93 (ref 0.88–1.12)
Prothrombin Time: 10.8 secs (ref 10.2–13.1)

## 2017-11-01 NOTE — Progress Notes (Signed)
52 year old male presents today for a pre op for [facility]. His Tramadol was reduce to #60/ month when he usually takes #120. He does have burning down the legs. He will have a titanium plate put in the SI joint.  He is tolerating his medications. His surgeon will manage his pain meds after surgery.    Review of Systems   Respiratory: Negative.    Cardiovascular: Negative.    Gastrointestinal: Negative.    Genitourinary: Negative.    Musculoskeletal: Positive for back pain and gait problem.   Neurological: Positive for weakness and numbness.   Psychiatric/Behavioral: Negative.      Physical Exam   Constitutional: He is oriented to person, place, and time. He appears well-developed and well-nourished. No distress.   HENT:   Right Ear: External ear normal.   Left Ear: External ear normal.   Nose: Nose normal.   Mouth/Throat: Oropharynx is clear and moist.   Eyes: Conjunctivae are normal.   Neck: No thyromegaly present.   Cardiovascular:   Irregular rhythm- EKG shows slight bradycardia and periodic PVC's.    Pulmonary/Chest: Effort normal and breath sounds normal. He has no wheezes. He has no rales.   Abdominal: Soft. Bowel sounds are normal.   Lymphadenopathy:     He has no cervical adenopathy.   Neurological: He is alert and oriented to person, place, and time. He displays normal reflexes.   Skin: Skin is warm and dry.   Psychiatric: He has a normal mood and affect.   Vitals reviewed.    1. Pre-op evaluation  The EKG will be sent to the surgeon to determine if a cardiology release is needed.    2. Midline low back pain with sciatica, sciatica laterality unspecified, unspecified chronicity  Continue medications as directed    3. DDD (degenerative disc disease), lumbar    4. Bulging lumbar disc  Follow up with surgeon. Labs were done but no results are back currently since it was done today.

## 2017-11-03 LAB — MMC TESTOSTERONE, FREE SENDOUT: Testosterone, Free: 28 pg/mL — ABNORMAL LOW (ref 47–244)

## 2017-11-07 ENCOUNTER — Other Ambulatory Visit (HOSPITAL_BASED_OUTPATIENT_CLINIC_OR_DEPARTMENT_OTHER): Payer: Self-pay | Admitting: Family

## 2017-11-08 ENCOUNTER — Other Ambulatory Visit (HOSPITAL_BASED_OUTPATIENT_CLINIC_OR_DEPARTMENT_OTHER): Payer: Self-pay | Admitting: Medical

## 2017-11-08 ENCOUNTER — Telehealth (HOSPITAL_BASED_OUTPATIENT_CLINIC_OR_DEPARTMENT_OTHER): Payer: Self-pay | Admitting: Medical

## 2017-11-08 DIAGNOSIS — I493 Ventricular premature depolarization: Secondary | ICD-10-CM

## 2017-11-08 NOTE — Telephone Encounter (Signed)
lmovm pt. Needs a urgent referral to cardiology for surgery clearance for abnormal EKG.

## 2017-11-08 NOTE — Telephone Encounter (Signed)
done

## 2017-11-09 ENCOUNTER — Telehealth (HOSPITAL_BASED_OUTPATIENT_CLINIC_OR_DEPARTMENT_OTHER): Payer: Self-pay | Admitting: Medical

## 2017-11-09 NOTE — Telephone Encounter (Signed)
lmovm requesting refill tramadol #120 [facility] [place]. The last fill you were out was only sent for #60.

## 2017-11-10 NOTE — Telephone Encounter (Signed)
VO called in for tramadol, qty of 120. Instructed pharmacy to fill today.

## 2017-11-10 NOTE — Telephone Encounter (Signed)
This should have been sent for #120.

## 2017-11-11 ENCOUNTER — Ambulatory Visit (HOSPITAL_BASED_OUTPATIENT_CLINIC_OR_DEPARTMENT_OTHER): Payer: Self-pay | Admitting: Cardiovascular Disease

## 2017-11-11 ENCOUNTER — Encounter (HOSPITAL_BASED_OUTPATIENT_CLINIC_OR_DEPARTMENT_OTHER): Payer: Self-pay | Admitting: Cardiovascular Disease

## 2017-11-11 VITALS — BP 118/70 | HR 60 | Resp 9 | Ht 72.0 in | Wt 204.0 lb

## 2017-11-11 DIAGNOSIS — I10 Essential (primary) hypertension: Secondary | ICD-10-CM

## 2017-11-11 DIAGNOSIS — Z01810 Encounter for preprocedural cardiovascular examination: Secondary | ICD-10-CM

## 2017-11-11 DIAGNOSIS — I493 Ventricular premature depolarization: Secondary | ICD-10-CM

## 2017-11-11 NOTE — Progress Notes (Signed)
CARDIOLOGY CLINIC INITIAL VISIT    Joe Roberts  MRN:  [id number]  DOB: 1965/11/21  Date of Service:  11/11/2017    Referring Physician: Eliezer Bottom    CHIEF COMPLAINT:  Establish Care (See HPI, review medications and EKG. Cardiac Risk Assesment. Reconciled medications with patient.)    HPI:   Dear Joe Roberts,  I had the pleasure of seeing Mr. Joe Roberts at our office today accompanied by his wife upon your referral to be evaluated for frequent PVCs noted at his November 01, 2017 ECG which was obtained as part of preop eval prior to sacroiliac joint surgery at [facility].    Mr. [name] has history of hypertension, long-term smoking reportedly on and off for 35 years which he has minimized to 2 cigarettes a week but has been vaping otherwise daily lately.  He is denying history of diabetes, hyper lipidemia requiring medical therapy, prior stroke or heart attack.  He is denying clinical palpitations, dizziness, anginal chest pain or unusual shortness of breath.  He is a Psychologist, occupational and his job has been physical and he has been climbing stairs, ladders, carrying heavy objects frequently at his job without unusual chest pain or shortness of breath.  He is denying history of near syncope/syncope.  He is not aware of sudden cardiac death in immediate family.    His labs in August 2018 have shown total cholesterol 173, HDL 46, triglycerides 88 and LDL 109 mg/dL.  His labs in January 2019 have shown nonfasting glucose 101, potassium 4.8, BUN 23, creatinine 0.92, hemoglobin 13.4 g/dL.    Upon auscultation, he is noted to have frequent ectopy every fifth beat or so with which he is clinically asymptomatic.  We discussed to check his magnesium, thyroid panel, 24-hour Holter monitor to assess ectopy burden and schedule him for a treadmill exercise stress echocardiogram to check his functional capacity, any significant arrhythmias in response to exercise, check LV systolic function and evaluate for evidence of exercise-induced myocardial  ischemia.    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    History of low back pain     HTN (hypertension)     Hypogonadism in male     Leg pain     Numbness and tingling     in both hands     PSH:  Past Surgical History:   Procedure Laterality Date    ------------OTHER-------------  2000    back surgery-[facility]    COLONOSCOPY  06/10/2016    polyps     SOCIAL HISTORY:  Socioeconomic History    Marital status: MARRIED     Spouse name: Not on file    Number of children: 3   Occupational History    Occupation: Psychologist, occupational   Tobacco Use    Smoking status: Former Smoker     Types: Cigarettes     Quit: 03/21/2016     Years since quitting: 1.6    Smokeless tobacco: Never Used   Substance and Sexual Activity    Alcohol use: Yes     Comment: 10 beers/week    Drug use: No     FAMILY HISTORY:  Family History   Problem Relation Name Age of Onset    Hypertension Mother      Breast Cancer Mother      COPD Mother      Other (bi-polar) Mother  Hypertension Father      Myocardial Infarction Father      Alzheimer's Father      Dementia Father      No Known Problems Brother      Other (old age) Maternal Grandmother      Cancer Maternal Grandfather          Lung    Alzheimer's Paternal Grandmother      Other (old age) Paternal Grandfather      No Known Problems Son      No Known Problems Daughter x2      ALLERGIES:    Flexeril [Cyclobenzaprine Hcl]    Unknown-Explain in Comments    Comment:agitated    CURRENT MEDS:    Current Outpatient Medications:     Acyclovir (ZOVIRAX) 400 mg tablet, TAKE 1 TABLET BY MOUTH TWICE DAILY, Disp: 60 tablet, Rfl: 1    Carisoprodol (SOMA) 350 mg Tablet, TAKE 1 TABLET BY MOUTH AT BEDTIME AS NEEDED, Disp: 30 tablet, Rfl: 0    Gabapentin (NEURONTIN) 300 mg Capsule, Take 1 capsule by mouth 3 times daily., Disp: 90 capsule, Rfl: 11    Hydrocodone 10 mg/Acetaminophen 325 mg (NORCO 10) per tablet,  three times daily, Disp: , Rfl:     Ibuprofen (MOTRIN) 800 mg Tablet, TAKE 1 TABLET BY MOUTH EVERY 6 HOURS WITH FOOD AS NEEDED, Disp: 60 tablet, Rfl: 0    Lisinopril (PRINIVIL, ZESTRIL) 20 mg Tablet, TAKE 1 TABLET BY MOUTH EVERY DAY, Disp: 90 tablet, Rfl: 3    Sildenafil (REVATIO) 20 mg Tablet, TAKE ONE OR TWO TABLETS BY MOUTH DAILY AS NEEDED , Disp: 30 tablet, Rfl: 0    TENS unit and electrodes Combo Pack, , Disp: , Rfl:     Testosterone (ANDROGEL) 1% (5 g/packet) gel, Apply 1 packet to the skin every morning. Apply to upper arms or shoulders, or use as directed by physician., Disp: 30 packet, Rfl: 0    Tramadol (ULTRAM) 50 mg Tablet, Take 1 tablet by mouth every 6 hours if needed. for pain, Disp: 120 tablet, Rfl: 0    REVIEW OF SYSTEMS:  General/Constitutional:   Fatigue: no. Fever: no. Weaknes: no. Weight change: no.   HEENT/Neck:   Nose bleeds: no. Post-nasal drip: no. Hoarseness:no. Trouble swallowing: no.   Endocrine:   Cold intolerance: no. Excessive thirst: no. Excessive urination: no. Heat intolerance: no.   Respiratory:   Cough: no. Hemoptysis: no. Phlegm: no. Snoring: no. Wheezing: no.   Cardiovascular:   Cardiovascular symptoms See HPI.   Gastrointestinal:   Appetite change: no. Change in bowel habits: no. Constipation: yes. Diarrhea: no.   Indigestion: no. Nausea: no. Rectal bleeding: no. Vomiting: no.   Hematology/ Lymph:   Anemia: no. Easy bleeding: no. Easy bruising: no.   Genitourinary:   Frequent Nighttime Urination: yes. Pain with urination: no. Urinary frequency: no. Urinary incontinence: no.   Musculoskeletal:   Muscle pain:yes. Joint pain: yes. Joint stiffness: yes. Joint swelling: yes. Muscle Cramping: yes.   Dermatologic:   Nail changes: no. Rash: no.   Neurologic:   Headache: no. Memory loss: no. Paralysis: no. Seizures: no. Tingling/numbness: no.   Tremor: no.   Psychiatric:   Anxiety: no. Attention Deficit: no. Depression: yes. Sleep disturbances: yes.     PHYSICAL EXAM:  BP 118/70  (SITE: left arm, Orthostatic Position: sitting, Cuff Size: regular)   Pulse 60   Resp 9   Ht 1.829 m (6')   Wt 92.5 kg (204 lb)   SpO2 93%  BMI 27.67 kg/m   GENERAL: Alert, oriented, pleasant, in no acute distress  .   EYES: Sclera anicteric, conjunctiva pink, no xanthalasma  .   ORAL CAVITY: Oropharynx no significant abnormality, mucous membranes moist  .   NECK: Supple, no jugular vein distention, no thyromegaly palpable, no carotid bruits audible  .   CARDIOVASCULAR: Irregular rhythm with frequent ectopy, variable S1, S2, 1/6 systolic murmur at left sternal border, no S3 audible. Point of maximal impulse is not palpable    CHEST: Normal respiratory effort  .   RESPIRATORY: Clear to auscultation, no rales, no wheezes, no rhonchi audible  .   GASTROINTESTINAL: Soft, non-distended, non-tender, normoactive bowel sounds, no hepatomegaly, no splenomegaly palpable, no abdominal bruits audible    NEUROLOGIC EXAM: Alert and oriented x 3, no gross motor deficits; appropriate for age  .   SKIN: No obvious rash, good turgor  .   EXTREMITIES: No pitting pretibial or pedal edema, no cyanosis, no clubbing    PERIPHERAL PULSES: 2+ symmetrical bilateral radial and posterior tibial pulses palpable  .   MUSCULOSKELETAL: No swelling or obvious deformity    LABS:  Lab Results   Lab Name Value Date/Time    WBC 8.3 11/01/2017 12:58 PM    HGB 13.4 11/01/2017 12:58 PM    HCT 39.5 11/01/2017 12:58 PM    PLT 166 11/01/2017 12:58 PM     Lab Results   Lab Name Value Date/Time    NA 137 11/01/2017 12:58 PM    K 4.8 11/01/2017 12:58 PM    CL 103 11/01/2017 12:58 PM    CO2 26 11/01/2017 12:58 PM    BUN 23 (H) 11/01/2017 12:58 PM    CR 0.92 11/01/2017 12:58 PM    GLU 101 (H) 11/01/2017 12:58 PM     Lab Results   Lab Name Value Date/Time    CHOL 173 05/30/2017 01:30 PM    LDLC 109 (H) 05/30/2017 01:30 PM    HDL 46 05/30/2017 01:30 PM    TRIG 88 05/30/2017 01:30 PM     No results found for: TSH, FT4     Lab Results   Lab Name Value  Date/Time    AST 31 11/01/2017 12:58 PM    ALT 33 11/01/2017 12:58 PM    ALP 51 11/01/2017 12:58 PM    TP 6.8 11/01/2017 12:58 PM        ECG: His ECG at our office today has shown normal sinus rhythm at 63 bpm, LVH by voltage, no ischemic ST segment changes.  Normal PR, QRS and QT durations.      His ECG on November 01, 2017 has shown sinus rhythm at 59 bpm, LVH by voltage and PVCs.    ASSESSMENT AND RECOMMENDATIONS:   1. PVC's (premature ventricular contractions)  He appears to have frequent PVCs by auscultation with which he is clinically asymptomatic.  Recommend further evaluation with Holter monitor and treadmill exercise stress echocardiogram.  We will also check his magnesium level and thyroid function.  Encouraged to keep a high magnesium content food in his diet like pumpkin seeds, green leafy vegetables, unsalted nuts, fish.    - POC ELECTROCARDIOGRAM WITH RHYTHM STRIP ([facility])  - [facility] THYROID PANEL; Future  - MAGNESIUM ([facility]); Future  - EXERCISE STRESS ECHOCARDIOGRAM; Future  - [facility] HOLTER MONITOR HOOK UP; Future  - HOLTER MONITOR; Future    2. Essential hypertension  His blood pressure is well controlled with lisinopril which she is recommended to continue  along with salt restriction and diet.    - POC ELECTROCARDIOGRAM WITH RHYTHM STRIP ([facility])  - [facility] THYROID PANEL; Future  - EXERCISE STRESS ECHOCARDIOGRAM; Future    3. 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  On the other side, given presumed frequent PVCs incidentally noted, it would be good to check his ectopic burden, evaluate for significant arrhythmias and check LV systolic function and evaluate for myocardial ischemia that could be contributing to  ventricular ectopy.  We will be scheduling him for a treadmill exercise stress echocardiogram and a Holter monitor and follow him with the findings.    All studies were reviewed with the patient. All questions were answered.    This note was partially created using Conservation officer, historic buildings. Some transcription errors may have escaped proofreading.    Xariah Silvernail Anders Grant, MD, MD, [facility]

## 2017-11-12 NOTE — Addendum Note (Signed)
Addended by: Melvia HeapsEKIN, Deagen Krass on: 11/12/2017 03:49 PM     Modules accepted: Orders

## 2017-11-13 ENCOUNTER — Ambulatory Visit: Payer: 59

## 2017-11-14 ENCOUNTER — Other Ambulatory Visit (HOSPITAL_BASED_OUTPATIENT_CLINIC_OR_DEPARTMENT_OTHER): Payer: Self-pay | Admitting: Family

## 2017-11-14 DIAGNOSIS — M5126 Other intervertebral disc displacement, lumbar region: Secondary | ICD-10-CM

## 2017-11-14 DIAGNOSIS — M5136 Other intervertebral disc degeneration, lumbar region: Secondary | ICD-10-CM

## 2017-11-15 ENCOUNTER — Ambulatory Visit
Admission: RE | Admit: 2017-11-15 | Discharge: 2017-11-15 | Disposition: A | Discharge status: Home / Self Care | Source: Ambulatory Visit | Attending: Cardiovascular Disease | Admitting: Cardiovascular Disease

## 2017-11-15 DIAGNOSIS — I493 Ventricular premature depolarization: Secondary | ICD-10-CM | POA: Insufficient documentation

## 2017-11-16 ENCOUNTER — Ambulatory Visit
Admission: RE | Admit: 2017-11-16 | Discharge: 2017-11-16 | Disposition: A | Discharge status: Home / Self Care | Source: Ambulatory Visit | Attending: Cardiovascular Disease | Admitting: Cardiovascular Disease

## 2017-11-16 DIAGNOSIS — I493 Ventricular premature depolarization: Secondary | ICD-10-CM

## 2017-11-17 ENCOUNTER — Ambulatory Visit: Attending: Medical

## 2017-11-17 DIAGNOSIS — I493 Ventricular premature depolarization: Secondary | ICD-10-CM

## 2017-11-17 DIAGNOSIS — I10 Essential (primary) hypertension: Secondary | ICD-10-CM

## 2017-11-17 LAB — MMC THYROID PANEL
Thyroid Stimulating Hormone: 2.42 u[IU]/mL (ref 0.45–5.33)
Thyroxine, Free (Free T4): 0.8 ng/dL (ref 0.6–1.6)

## 2017-11-17 LAB — MAGNESIUM (MG): Magnesium (Mg): 1.9 mg/dL (ref 1.8–2.5)

## 2017-11-18 ENCOUNTER — Ambulatory Visit
Admission: RE | Admit: 2017-11-18 | Discharge: 2017-11-18 | Disposition: A | Discharge status: Home / Self Care | Source: Ambulatory Visit | Attending: Cardiovascular Disease | Admitting: Cardiovascular Disease

## 2017-11-18 DIAGNOSIS — I493 Ventricular premature depolarization: Secondary | ICD-10-CM

## 2017-11-18 DIAGNOSIS — Z01810 Encounter for preprocedural cardiovascular examination: Secondary | ICD-10-CM

## 2017-11-18 DIAGNOSIS — I10 Essential (primary) hypertension: Secondary | ICD-10-CM

## 2017-11-19 ENCOUNTER — Encounter (HOSPITAL_BASED_OUTPATIENT_CLINIC_OR_DEPARTMENT_OTHER): Payer: Self-pay | Admitting: Cardiovascular Disease

## 2017-11-19 ENCOUNTER — Ambulatory Visit (HOSPITAL_BASED_OUTPATIENT_CLINIC_OR_DEPARTMENT_OTHER): Payer: 59 | Admitting: Cardiovascular Disease

## 2017-11-19 VITALS — BP 124/80 | HR 52 | Resp 9 | Ht 72.0 in | Wt 201.0 lb

## 2017-11-19 DIAGNOSIS — I10 Essential (primary) hypertension: Secondary | ICD-10-CM

## 2017-11-19 DIAGNOSIS — Z01810 Encounter for preprocedural cardiovascular examination: Secondary | ICD-10-CM

## 2017-11-19 DIAGNOSIS — I493 Ventricular premature depolarization: Secondary | ICD-10-CM

## 2017-11-19 NOTE — Progress Notes (Signed)
CARDIOLOGY CLINIC FOLLOW-UP VISIT    Joe Roberts  MRN:  [id number]  DOB: 04/03/1966  PCP: Eliezer BottomHeuser, Lori S, PA    Date of Service:  11/19/2017    CHIEF COMPLAINT:  Follow Up With Specialist (See HPI, review medications, labs and studies. Patient feels well today. Mecications reconciled with patient.)    HPI:    Mr. Joe Roberts was initially seen at our office earlier in January 2019 accompanied by his wife upon referral to be evaluated for frequent PVCs noted at his November 01, 2017 ECG which was obtained as part of preop eval prior to [facility].    Mr. Joe Roberts had history of hypertension, long-term smoking reportedly on and off for 35 years which he has minimized to 2 cigarettes a week but has been vaping otherwise daily lately.  He  was denying history of diabetes, hyper lipidemia requiring medical therapy, prior stroke or heart attack.  He  was denying clinical palpitations, dizziness, anginal chest pain or unusual shortness of breath.  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  He  was denying history of near syncope/syncope.  He  was not aware of sudden cardiac death in immediate family.    His labs in August 2018 have shown total cholesterol 173, HDL 46, triglycerides 88 and LDL 109 mg/dL.  His labs in January 2019 have shown nonfasting glucose 101, potassium 4.8, BUN 23, creatinine 0.92, hemoglobin 13.4 g/dL.    Upon auscultation, he was noted to have frequent ectopy with which he was clinically asymptomatic.  We discussed to check his magnesium, thyroid panel, 24-hour Holter monitor to assess ectopy burden and schedule him for a treadmill exercise stress echocardiogram to check his functional capacity, any significant arrhythmias in response to exercise, check LV systolic function and evaluate for evidence of exercise-induced myocardial  ischemia.    His labs on November 17, 2017 have shown magnesium 1.9, TSH 2.42, free T4 0.8 which were within normal range.    His Holter monitor on November 15, 2016 has shown normal sinus rhythm with average heart rate 70 bpm with heart rate range 49-107 bpm.  He had frequent monomorphic PVCs consisting of 10,015 beats comprising 9.9% of total beats which had left bundle branch block and inferior axis pattern suggestive of RVOT origin.  There were only 2 ventricular triplets and no significant ventricular tachycardia was noted.  He had rare PACs and had only 1 3 beat atrial run.  No significant pauses or high degree AV block were noted.  He has reported no symptoms at [facility].    He underwent treadmill exercise stress echocardiogram on November 18, 2017.  Heart rate at rest was 62 bpm.  He has exercised 8:52 minutes nearly completing stage III.  He had mild back pain at baseline.  He has reported no chest pain.  He has achieved 94% of his maximum predicted heart rate and 10.1 METs of workload.  He has developed no significant, diagnostic ischemic ST segment changes.  He was noted to have frequent unifocal PVCs and occasional ventricular bigeminy and couplets with PVC pattern consistent with RVOT origin.  Heart rate recovery at 1 minute was normal with his heart rate decreasing to 131 bpm.  No exercise-induced ventricular tachycardia was noted.  His echocardiogram has shown upper normal left atrial size, normal left ventricular  size, wall motion and systolic function with estimated ejection fraction 55-60%.  He had normal right ventricular size and systolic function.  He had trace mitral and trace tricuspid and trivial pulmonic valve regurgitations.  He had I/IV left ventricular diastolic dysfunction.  No pericardial effusion was seen.    He is reporting to be clinically feeling well and he is not feeling any heart skipping or stopping and starting-like sensation or palpitations.  He is denying unusual dizziness  or near syncope.  We discussed that by his echocardiogram, he appears to have a structurally normal heart and his frequent unifocal PVCs appears to be right ventricle outflow tract origin which is usually benign.  There is rare possibility of arrhythmogenic RV dysplasia, but given no family history of sudden death, no evidence of exercise-induced ventricular tachycardia at the treadmill exercise stress echocardiogram the possibility is quite low.  We discussed that suppressing asymptomatic presumed benign RVOT PVCs has not been shown to decrease mortality but given quite high ventricular ectopy burden, he is recommended to monitor his symptoms and if he starts any unusual symptoms related to PVCs like palpitations, dizziness, near syncope, he might benefit from trial of low-dose beta blockers or calcium channel blockers and if not tolerated or not effective, he would benefit from catheter ablation by an EP specialist.  We discussed that it would be also reasonable to monitor his LV systolic function over time to make sure frequent ventricular ectopy is not causing cardiomyopathy over time which then would be an indication to treat his PVCs.    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    History of low back pain     HTN (hypertension)     Hypogonadism in male     Leg pain     Numbness and tingling     in both hands     PSH:  Past Surgical History:   Procedure Laterality Date    ------------OTHER-------------  2000    back surgery-[facility]    COLONOSCOPY  06/10/2016    polyps     SOCIAL HISTORY:  Socioeconomic History    Marital status: MARRIED     Spouse name: Not on file    Number of children: 3   Occupational History    Occupation: Psychologist, occupational   Tobacco Use    Smoking status: Former Smoker     Types: Cigarettes     Quit: 03/21/2016     Years since quitting: 1.6    Smokeless tobacco: Never Used   Substance and Sexual  Activity    Alcohol use: Yes     Comment: 10 beers/week    Drug use: No     FAMILY HISTORY:  Family History   Problem Relation Name Age of Onset    Hypertension Mother      Breast Cancer Mother      COPD Mother      Other (bi-polar) Mother      Hypertension Father      Myocardial Infarction Father      Alzheimer's Father      Dementia Father      No Known Problems Brother      Other (old age) Maternal Grandmother      Cancer Maternal Grandfather          Lung    Alzheimer's Paternal Grandmother      Other (old  age) Paternal Grandfather      No Known Problems Son      No Known Problems Daughter x2      ALLERGIES:    Flexeril [Cyclobenzaprine Hcl]    Unknown-Explain in Comments    Comment:agitated    CURRENT MEDS:    Current Outpatient Medications:     Acyclovir (ZOVIRAX) 400 mg tablet, TAKE 1 TABLET BY MOUTH TWICE DAILY, Disp: 60 tablet, Rfl: 1    Carisoprodol (SOMA) 350 mg Tablet, TAKE 1 TABLET BY MOUTH AT BEDTIME AS NEEDED, Disp: 30 tablet, Rfl: 0    Gabapentin (NEURONTIN) 300 mg Capsule, Take 1 capsule by mouth 3 times daily., Disp: 90 capsule, Rfl: 11    Hydrocodone 10 mg/Acetaminophen 325 mg (NORCO 10) per tablet, three times daily, Disp: , Rfl:     Ibuprofen (MOTRIN) 800 mg Tablet, TAKE 1 TABLET BY MOUTH EVERY 6 HOURS WITH FOOD AS NEEDED, Disp: 60 tablet, Rfl: 0    Lisinopril (PRINIVIL, ZESTRIL) 20 mg Tablet, TAKE 1 TABLET BY MOUTH EVERY DAY, Disp: 90 tablet, Rfl: 3    Sildenafil (REVATIO) 20 mg Tablet, TAKE ONE OR TWO TABLETS BY MOUTH DAILY AS NEEDED , Disp: 30 tablet, Rfl: 0    TENS unit and electrodes Combo Pack, , Disp: , Rfl:     Testosterone (ANDROGEL) 1% (5 g/packet) gel, Apply 1 packet to the skin every morning. Apply to upper arms or shoulders, or use as directed by physician., Disp: 30 packet, Rfl: 0    Tramadol (ULTRAM) 50 mg Tablet, Take 1 tablet by mouth every 6 hours if needed. for pain, Disp: 120 tablet, Rfl: 0    Follow-Up Review of Systems:   Cardiology: No  orthopnea/PND, no anginal chest pain, or chest tightness, no palpitations, dizziness or near syncope, no pedal edema.   Gastroenterology: No change in appetite, has occasional constipation, no melena, no heartburn symptoms.   General: No fever, no chills, he has lost about 3 pounds over the last week.  Genitourinary: No hematuria or dysuria.  Has occasional nocturia.  Pulmonology: No productive cough or wheezing.      PHYSICAL EXAM:  BP 124/80 (SITE: left arm, Orthostatic Position: sitting, Cuff Size: regular)   Pulse 52   Resp 9   Ht 1.829 m (6')   Wt 91.2 kg (201 lb)   SpO2 97%   BMI 27.26 kg/m    GENERAL: Alert, in no acute distress  .   HEENT: Sclera anicteric, oral mucosa is moist, no xanthelasma seen  .   NECK: No jugular venous distention noted sitting upright, no carotid bruits audible  .   CARDIOVASCULAR: Irregular rhythm with frequent ectopy, variable S1, S2, 1/6 systolic murmur at left sternal border, no S3 audible.    CHEST: Clear to auscultation, no rales or wheezing audible  .   GASTROINTESTINAL: Soft, non-tender, normoactive bowel sounds  .   NEUROLOGIC EXAM: Moving both upper and lower extremities, bilaterally, equally; grossly nonfocal motor exam  .   EXTREMITIES: No clubbing, no cyanosis, no pitting pretibial edema noted today   .   PERIPHERAL PULSES: (2+) radial and posterior tibial pulses palpable bilaterally    LABS:  Lab Results   Lab Name Value Date/Time    WBC 8.3 11/01/2017 12:58 PM    HGB 13.4 11/01/2017 12:58 PM    HCT 39.5 11/01/2017 12:58 PM    PLT 166 11/01/2017 12:58 PM     Lab Results   Lab Name Value Date/Time  NA 137 11/01/2017 12:58 PM    K 4.8 11/01/2017 12:58 PM    CL 103 11/01/2017 12:58 PM    CO2 26 11/01/2017 12:58 PM    BUN 23 (H) 11/01/2017 12:58 PM    CR 0.92 11/01/2017 12:58 PM    GLU 101 (H) 11/01/2017 12:58 PM    MG 1.9 11/17/2017 04:01 PM     Lab Results   Lab Name Value Date/Time    CHOL 173 05/30/2017 01:30 PM    LDLC 109 (H) 05/30/2017 01:30 PM    HDL 46  05/30/2017 01:30 PM    TRIG 88 05/30/2017 01:30 PM     Lab Results   Lab Name Value Date/Time    TSH 2.42 11/17/2017 04:01 PM    FT4 0.8 11/17/2017 04:01 PM      Lab Results   Lab Name Value Date/Time    AST 31 11/01/2017 12:58 PM    ALT 33 11/01/2017 12:58 PM    ALP 51 11/01/2017 12:58 PM    TP 6.8 11/01/2017 12:58 PM        ASSESSMENT AND RECOMMENDATIONS:   1. Frequent unifocal PVCs  His Holter monitor and stress echocardiogram revealed presence of frequent unifocal PVCs that appeared to be RVOT origin which usually have benign prognosis when asymptomatic and with no evidence of underlying structural heart disease and no evidence of ventricular tachycardia at stress test or Holter monitor.  We discussed to try low-dose beta blockers or calcium channel blockers if he starts having associated symptoms in which case he should notify us and if in failure of beta-blocker or calcium channel blocker or intolerance, we would plan referral to EP specialist for evaluation for possible catheter ablation.  We discussed importance of keeping magnesium and potassium within normal range.  His magnesium was in low normal range and he is recommended to keep a high magnesium content food like pumpkin seeds, green leafy vegetables, unsalted nuts, fish in his diet regularly.  He is recommended to have follow-ups with a cardiologist every 6-12 months to follow-up on symptoms and periodic echocardiograms over time to follow-up on his LV systolic function.    - MAGNESIUM ([facility]); Future    2. Essential hypertension  His blood pressure has been well controlled with lisinopril which is recommended to continue along with salt restriction in diet.    - MAGNESIUM ([facility]); Future  - BASIC METABOLIC PANEL; Future    3. Preoperative cardiovascular examination  We discussed that given normal LV systolic function and no evidence of exercise-induced myocardial ischemia or significant arrhythmias at his stress echocardiogram despite having frequent  unifocal PVCs that appear to be RVOT origin with usually benign prognosis, he should be able to proceed with elective sacroiliac joint surgery next week as planned at low perioperative cardiovascular morbidity/mortality risk.    All studies were reviewed with the patient. All questions were answered.    This note was partially created using Conservation officer, historic buildings. Some transcription errors may have escaped proofreading.    Keisuke Hollabaugh Anders Grant, MD, [facility]

## 2017-11-26 MED ORDER — HYDROCODONE 10 MG-ACETAMINOPHEN 325 MG TABLET
1.00 | ORAL_TABLET | ORAL | Status: DC
Start: ? — End: 2017-11-26

## 2017-11-26 MED ORDER — ACETAMINOPHEN 325 MG TABLET
650.00 mg | ORAL_TABLET | ORAL | Status: DC
Start: ? — End: 2017-11-26

## 2017-11-26 MED ORDER — SODIUM CHLORIDE 0.9 % INTRAVENOUS SOLUTION
INTRAVENOUS | Status: DC
Start: ? — End: 2017-11-26

## 2017-11-26 MED ORDER — ZINC SULFATE 220 MG (50 MG) CAPSULE
ORAL_CAPSULE | ORAL | Status: DC
Start: 2017-11-27 — End: 2017-11-26

## 2017-11-26 MED ORDER — HYDRALAZINE 20 MG/ML INJECTION SOLUTION
5.00 mg | INTRAMUSCULAR | Status: DC
Start: ? — End: 2017-11-26

## 2017-11-26 MED ORDER — LISINOPRIL 20 MG TABLET
20.00 mg | ORAL_TABLET | ORAL | Status: DC
Start: 2017-11-27 — End: 2017-11-26

## 2017-11-26 MED ORDER — CARISOPRODOL 350 MG TABLET
350.00 mg | ORAL_TABLET | ORAL | Status: DC
Start: 2017-11-26 — End: 2017-11-26

## 2017-11-26 MED ORDER — DOCUSATE SODIUM 100 MG CAPSULE
100.00 mg | ORAL_CAPSULE | ORAL | Status: DC
Start: 2017-11-26 — End: 2017-11-26

## 2017-11-26 MED ORDER — ZOLPIDEM 5 MG TABLET
5.00 mg | ORAL_TABLET | ORAL | Status: DC
Start: ? — End: 2017-11-26

## 2017-11-26 MED ORDER — MORPHINE (PF) 1 MG/ML INJECTION SOLUTION
4.00 mg | INTRAMUSCULAR | Status: DC
Start: ? — End: 2017-11-26

## 2017-11-26 MED ORDER — ALUMINUM-MAG HYDROXIDE-SIMETHICONE 200 MG-200 MG-20 MG/5 ML ORAL SUSP
30.00 mL | ORAL | Status: DC
Start: ? — End: 2017-11-26

## 2017-11-26 MED ORDER — TRAMADOL 50 MG TABLET
50.00 mg | ORAL_TABLET | ORAL | Status: DC
Start: ? — End: 2017-11-26

## 2017-11-26 MED ORDER — MORPHINE (PF) 1 MG/ML INJECTION SOLUTION
5.00 mg | INTRAMUSCULAR | Status: DC
Start: ? — End: 2017-11-26

## 2017-11-26 MED ORDER — DIPHENHYDRAMINE 50 MG/ML INJECTION SYRINGE
25.00 mg | INJECTION | INTRAMUSCULAR | Status: DC
Start: ? — End: 2017-11-26

## 2017-11-26 MED ORDER — GABAPENTIN 300 MG CAPSULE
300.00 mg | ORAL_CAPSULE | ORAL | Status: DC
Start: 2017-11-26 — End: 2017-11-26

## 2017-11-26 MED ORDER — GENERIC EXTERNAL MEDICATION
500.00 mg | Status: DC
Start: 2017-11-26 — End: 2017-11-26

## 2017-11-26 MED ORDER — ACYCLOVIR 400 MG TABLET
400.00 mg | ORAL_TABLET | ORAL | Status: DC
Start: 2017-11-26 — End: 2017-11-26

## 2017-11-26 MED ORDER — ONDANSETRON HCL (PF) 4 MG/2 ML INJECTION SOLUTION
4.00 mg | INTRAMUSCULAR | Status: DC
Start: ? — End: 2017-11-26

## 2017-11-26 MED ORDER — HYDROCODONE 7.5 MG-ACETAMINOPHEN 325 MG/15 ML ORAL SOLUTION
20.00 mL | ORAL | Status: DC
Start: ? — End: 2017-11-26

## 2017-11-29 ENCOUNTER — Telehealth (HOSPITAL_BASED_OUTPATIENT_CLINIC_OR_DEPARTMENT_OTHER): Payer: Self-pay | Admitting: Medical

## 2017-11-29 ENCOUNTER — Other Ambulatory Visit (HOSPITAL_BASED_OUTPATIENT_CLINIC_OR_DEPARTMENT_OTHER): Payer: Self-pay | Admitting: Medical

## 2017-11-29 DIAGNOSIS — R39198 Other difficulties with micturition: Secondary | ICD-10-CM

## 2017-11-29 MED ORDER — TAMSULOSIN 0.4 MG CAPSULE
0.4000 mg | ORAL_CAPSULE | Freq: Every day | ORAL | 0 refills | Status: DC
Start: 2017-11-29 — End: 2017-11-29

## 2017-11-29 NOTE — Telephone Encounter (Signed)
[place] called, her husband had they surgery and needs medication to make him urinate. He is also in a lot of pain, the surgeon did not give him enough pain medication. The surgeon also said to contact the PCP about the urine problem.

## 2017-11-29 NOTE — Telephone Encounter (Signed)
I gave him #10 for 10 days. If this continues past that, he needs to be seen and his surgeon needs to be involved. [facility]

## 2017-11-29 NOTE — Telephone Encounter (Signed)
No he needs to be treated by the surgeon for the  Pain. After discussing with Dr Maple HudsonYoung, if he is not urinating he needs to go to the ER for catheterization now. His surgeon needs to know this also.   If he is still urinating we can prescribe Flomax but hold this info until she describes the condition

## 2017-11-29 NOTE — Telephone Encounter (Signed)
He is able to urinate, but it is very hard and he really has to push. I told him you would send in the Flomax for him. He will also get a hold of the surgeon.

## 2017-11-29 NOTE — Telephone Encounter (Signed)
ML out of office, please fill

## 2017-11-30 ENCOUNTER — Other Ambulatory Visit (HOSPITAL_BASED_OUTPATIENT_CLINIC_OR_DEPARTMENT_OTHER): Payer: Self-pay | Admitting: Medical

## 2017-12-01 NOTE — Telephone Encounter (Signed)
Pt aware, s/w, rx is helping

## 2017-12-01 NOTE — Telephone Encounter (Signed)
S/w wife, she wanted us to be awarethatthe patient has always been able to urinate, just dribbles, and takes long time. No they did not contact the surgeon, he said he did not want to deal with it. The medication is helping though.

## 2017-12-01 NOTE — Telephone Encounter (Signed)
Continue the medication, but if he is not able to completely relieve his bladder, he needs catheterization in the ER. Dribbling is not acceptable as he needs to empty the bladder to decrease the possiblity of infection

## 2017-12-01 NOTE — Telephone Encounter (Signed)
S/w pt, he says he feels as though he is emptying bladder more fully now, but will call or go in if needed.

## 2017-12-01 NOTE — Telephone Encounter (Signed)
Ok to fill 

## 2017-12-01 NOTE — Telephone Encounter (Signed)
Pt aware. Medication seems to be helping

## 2017-12-01 NOTE — Telephone Encounter (Signed)
VO for carisoprodol called [facility] to [facility].

## 2017-12-01 NOTE — Telephone Encounter (Signed)
Please advise if ok fill. Thanks

## 2017-12-02 NOTE — Telephone Encounter (Signed)
I'm confused. Looks like you sent this in 3 days ago, so he should have 1 week left. Does he need to RTO next week?

## 2017-12-02 NOTE — Telephone Encounter (Signed)
Ok to fill for only another 10 days only, follow up in 10 days with us or with the surgeon

## 2017-12-02 NOTE — Telephone Encounter (Signed)
He has no appointment with us but just had surgery the end of January. His surgeon won't deal with the urinary retention. The retention should not persist so short term use of the medication is indicated. He should be only on one daily and if this is too early, he needs to wait on this. Follow up with Kob[facility] or Timmie FoersterBarba would be best

## 2017-12-11 ENCOUNTER — Other Ambulatory Visit (HOSPITAL_BASED_OUTPATIENT_CLINIC_OR_DEPARTMENT_OTHER): Payer: Self-pay | Admitting: Medical

## 2017-12-14 ENCOUNTER — Ambulatory Visit (HOSPITAL_BASED_OUTPATIENT_CLINIC_OR_DEPARTMENT_OTHER): Payer: 59 | Admitting: Medical

## 2017-12-14 ENCOUNTER — Encounter (HOSPITAL_BASED_OUTPATIENT_CLINIC_OR_DEPARTMENT_OTHER): Payer: Self-pay | Admitting: Medical

## 2017-12-14 VITALS — BP 122/78 | HR 59 | Temp 97.4°F | Resp 12 | Wt 200.0 lb

## 2017-12-14 DIAGNOSIS — R39198 Other difficulties with micturition: Secondary | ICD-10-CM

## 2017-12-14 DIAGNOSIS — Z9889 Other specified postprocedural states: Secondary | ICD-10-CM

## 2017-12-14 MED ORDER — TAMSULOSIN 0.4 MG CAPSULE
ORAL_CAPSULE | ORAL | 1 refills | Status: DC
Start: 2017-12-14 — End: 2018-06-23

## 2017-12-14 NOTE — Telephone Encounter (Signed)
Please advise if ok to fill. Thanks

## 2017-12-14 NOTE — Telephone Encounter (Signed)
I will discuss this afternoon at his appointment

## 2017-12-14 NOTE — Patient Instructions (Signed)
Benign prostatic hypertrophy is the most common option

## 2017-12-14 NOTE — Telephone Encounter (Signed)
Discussed with patient at appt today. He does not need this prescription at this time

## 2017-12-14 NOTE — Progress Notes (Signed)
52 year old male presents today with his wife for follow up of urinary issues after surgery. He had 3 titanium rods put in the sacroiliac region. He is only 3 weeks out from surgery and feels greatly improved with the low back pain and radiating leg pain. After 2 years its gone.   He had to be catheterized 3 times after surgery. Since then he is still having some decreased urine flow and at times has to wait and push the urine out he states. He is on Flomax daily and this has greatly helped with the urination in general.    He was given some Percocet from his surgeon but feels he will be reducing the pain meds as time goes on. He is not taking Norco at this time. He continues to take SOMA at night but does not feel he needs it. He also wants to be off pain medications but feels he may need to be on Tramadol at least for awhile. He will be off work until May per his Careers advisersurgeon as the fusion needs time.  He is low on Testosterone, has ED and at some point would like to be put on testosterone supplementation. We discussed prostatic involvement and the need for a prostate exam.    Review of Systems   Constitutional: Negative.    Respiratory: Negative.    Cardiovascular: Negative.    Genitourinary: Positive for decreased urine volume and difficulty urinating. Negative for dysuria, enuresis, flank pain, frequency, hematuria and urgency.   Musculoskeletal:        Back is more sore than painful   Neurological: Negative.  Negative for weakness and numbness.   Psychiatric/Behavioral: Negative.      Physical Exam   Constitutional: He is oriented to person, place, and time. He appears well-developed and well-nourished.   Genitourinary:   Genitourinary Comments: Not done   Neurological: He is alert and oriented to person, place, and time.   Skin: Skin is warm and dry.   Psychiatric:   He is smiling, sitting more comfortably than he ever has. He seems happy and relaxed   Vitals reviewed.    1. Difficulty urinating  Monitor as we  have discussed. Use daily. Dr Marisa CyphersKoble will do a prostate exam. This is most likely BPH and this has been discussed him  - Tamsulosin (FLOMAX) 0.4 mg Capsule; Take 1 tablet daily  Dispense: 30 capsule; Refill: 1    2. H/O spinal surgery  He is doing a great job reducing the amount of pain medication and muscle relaxants. Take 1/2 SOMA nightly for about 1 week then see if he can do without any.

## 2017-12-17 ENCOUNTER — Ambulatory Visit (HOSPITAL_BASED_OUTPATIENT_CLINIC_OR_DEPARTMENT_OTHER): Payer: Self-pay | Admitting: Family Medicine

## 2017-12-17 NOTE — Progress Notes (Deleted)
HPI:        ROS:  GEN: No fever or chills. Good general state of health.  HEENT: No ear pain or fullness. No nasal discharge. No sore throat   PULM:  No cough wheezing or shortness of breath.  CARD: No chest pain, palpitations,or change in functional capacity  GI: No abdominal pain, nausea, vomiting, or change in bowel habits  NEURO: No headaches or dizziness  PSYCH: No depression or anxiety.     EXAM:    GENERAL: Patient is alert, cooperative, in no acute distress  EYES: Conjunctiva clear,without discharge  HEENT: External ear canals are clear, TM's clear bilaterally, Nose clear without discharge, Oropharynx appears normal, moist mucous membranes.  NECK: Supple without adenopathy  HEART: Regular rate and rhythm without murmurs  CHEST: Normal shape and expansion  LUNGS: Clear to auscultation without wheezes, rales or rhonchi  NEURO: Pt is alert friendly and cooperative, normal speech and gait, CN II-XII grossly intact without focal deficits  PSYCH: Patient  is alert friendly and cooperative,normal speech, good eye contact, appropriate mood and affect, no thought disorders        Assessment and Plan

## 2017-12-21 ENCOUNTER — Telehealth (HOSPITAL_BASED_OUTPATIENT_CLINIC_OR_DEPARTMENT_OTHER): Payer: Self-pay | Admitting: Cardiovascular Disease

## 2017-12-21 NOTE — Telephone Encounter (Signed)
Pt would like to switch care to a different provider. Ok for provider switch?

## 2017-12-21 NOTE — Telephone Encounter (Signed)
OK to switch providers. Thank you.

## 2017-12-21 NOTE — Telephone Encounter (Signed)
Tried calling pt to schedule a provider switch appt, we got disconnected called pt back but it went straight to vm.

## 2017-12-21 NOTE — Telephone Encounter (Signed)
Sure, is fine. Thanks.

## 2017-12-21 NOTE — Telephone Encounter (Signed)
Dr. Aubery LappingYoder ok if pt switches care to you? If so when would you like to see the pt? And how long 15 minutes or 30 minutes? Please advise thank you

## 2017-12-23 ENCOUNTER — Ambulatory Visit (HOSPITAL_BASED_OUTPATIENT_CLINIC_OR_DEPARTMENT_OTHER): Payer: Self-pay | Admitting: Medical

## 2017-12-27 ENCOUNTER — Other Ambulatory Visit (HOSPITAL_BASED_OUTPATIENT_CLINIC_OR_DEPARTMENT_OTHER): Payer: Self-pay | Admitting: Medical

## 2017-12-27 NOTE — Telephone Encounter (Signed)
Please advise if ok to fill. Thanks

## 2017-12-27 NOTE — Telephone Encounter (Signed)
VO for carisoprodol called [facility] to [facility].

## 2017-12-27 NOTE — Telephone Encounter (Signed)
CURES up to date, ok to fill

## 2017-12-31 NOTE — Telephone Encounter (Signed)
Lmom to get his July appt switched from [facility] to [facility] for provider switch

## 2018-01-18 ENCOUNTER — Other Ambulatory Visit (HOSPITAL_BASED_OUTPATIENT_CLINIC_OR_DEPARTMENT_OTHER): Payer: Self-pay | Admitting: Family

## 2018-02-03 ENCOUNTER — Encounter (HOSPITAL_BASED_OUTPATIENT_CLINIC_OR_DEPARTMENT_OTHER): Payer: Self-pay | Admitting: Medical

## 2018-02-03 ENCOUNTER — Telehealth (HOSPITAL_BASED_OUTPATIENT_CLINIC_OR_DEPARTMENT_OTHER): Payer: Self-pay | Admitting: Medical

## 2018-02-03 NOTE — Telephone Encounter (Signed)
lmovm advising ready for PU

## 2018-02-03 NOTE — Telephone Encounter (Signed)
I will only give him #30 Percocet to last the next month. No more than 2 daily if needed. CURES run

## 2018-02-03 NOTE — Telephone Encounter (Signed)
Wife LMOVM surgeon took him off all pain meds is having major withdrawal sx. Nausea abdominal cramping pain, sweats . They have tried OTC remedies such as pepto ,tylenol she knows its temporary. Wanting rx to help with sx . His insurance was canceled until the end of May so she can not bring him in.

## 2018-03-29 ENCOUNTER — Other Ambulatory Visit (HOSPITAL_BASED_OUTPATIENT_CLINIC_OR_DEPARTMENT_OTHER): Payer: Self-pay | Admitting: Family

## 2018-03-29 DIAGNOSIS — M5136 Other intervertebral disc degeneration, lumbar region: Secondary | ICD-10-CM

## 2018-03-29 DIAGNOSIS — M5126 Other intervertebral disc displacement, lumbar region: Secondary | ICD-10-CM

## 2018-03-29 NOTE — Telephone Encounter (Signed)
ML out of office

## 2018-03-29 NOTE — Telephone Encounter (Signed)
[facility]

## 2018-03-30 NOTE — Telephone Encounter (Signed)
Spoke with patients wife and she said that he does not want a phys. Due to all the medical appointments and surgeries he has had lately. [facility]

## 2018-04-04 ENCOUNTER — Encounter (HOSPITAL_BASED_OUTPATIENT_CLINIC_OR_DEPARTMENT_OTHER): Payer: Self-pay | Admitting: Medical

## 2018-04-04 ENCOUNTER — Ambulatory Visit (HOSPITAL_BASED_OUTPATIENT_CLINIC_OR_DEPARTMENT_OTHER): Payer: Self-pay | Admitting: Medical

## 2018-04-04 VITALS — BP 130/80 | HR 61 | Temp 97.8°F | Resp 12 | Wt 211.0 lb

## 2018-04-04 DIAGNOSIS — M533 Sacrococcygeal disorders, not elsewhere classified: Secondary | ICD-10-CM

## 2018-04-04 MED ORDER — LIDOCAINE 5 % TOPICAL PATCH
1.0000 | MEDICATED_PATCH | TOPICAL | 3 refills | Status: DC
Start: 2018-04-04 — End: 2018-06-23

## 2018-04-04 NOTE — Progress Notes (Signed)
52 year old male presents today with his wife for coccyx pain. This started in February, post op from SI fusion. He states the pain is 10/10 when sitting or having a BM. He is without discomfort when standing but he commutes to work and the pain continues. He is only taking Norco 1/2 twice daily for the pain. This is so much better than he was prior to the surgery. He was given Percocet and ended up getting off it due to the side effects. He is doing much better on Norco. He wants to be off all pain meds but the coccyx pain is severe.    Review of Systems   Constitutional: Negative.    Gastrointestinal: Negative.    Genitourinary:        Pain with bowel movements   Musculoskeletal:        Coccyx pain     Neurological: Negative for weakness and numbness.   Psychiatric/Behavioral: Negative.      Physical Exam   Constitutional: He is oriented to person, place, and time. He appears well-developed and well-nourished.   Neurological: He is alert and oriented to person, place, and time.   Psychiatric: He has a normal mood and affect.   Vitals reviewed.    1. Coccydynia  Follow up with the surgeon for ongoing evaluation  - Lidocaine (LIDODERM) 5 %(700 mg/patch) Patch; Apply 1 patch to the skin every 24 hours. Apply to painful area (12 hours on, 12 hours off)  Dispense: 30 patch; Refill: 3  Call next week for a refill of Norco #30. CURES run. No more Percocet

## 2018-04-11 ENCOUNTER — Telehealth (HOSPITAL_BASED_OUTPATIENT_CLINIC_OR_DEPARTMENT_OTHER): Payer: Self-pay | Admitting: Medical

## 2018-04-11 ENCOUNTER — Other Ambulatory Visit (HOSPITAL_BASED_OUTPATIENT_CLINIC_OR_DEPARTMENT_OTHER): Payer: Self-pay | Admitting: Family

## 2018-04-11 DIAGNOSIS — M533 Sacrococcygeal disorders, not elsewhere classified: Secondary | ICD-10-CM

## 2018-04-11 DIAGNOSIS — Z9889 Other specified postprocedural states: Secondary | ICD-10-CM

## 2018-04-11 NOTE — Telephone Encounter (Signed)
Sherriton Lft message Joe Roberts is done [facility]. She is asking if he can be referred to a local surgeon in [place] for his new issue?

## 2018-04-11 NOTE — Telephone Encounter (Signed)
It would be better for [facility] care [facility]. It would take longer to get in with no certainty of surgical or other intervention.

## 2018-04-11 NOTE — Telephone Encounter (Signed)
Spoke to [facility] would like a referral set up to pain management for injection.

## 2018-04-12 NOTE — Telephone Encounter (Signed)
[facility] [place] for [facility] management or even through [facility]

## 2018-04-13 ENCOUNTER — Other Ambulatory Visit (HOSPITAL_BASED_OUTPATIENT_CLINIC_OR_DEPARTMENT_OTHER): Payer: Self-pay | Admitting: Medical

## 2018-04-13 DIAGNOSIS — I10 Essential (primary) hypertension: Secondary | ICD-10-CM

## 2018-04-13 DIAGNOSIS — G8929 Other chronic pain: Secondary | ICD-10-CM

## 2018-04-13 DIAGNOSIS — M533 Sacrococcygeal disorders, not elsewhere classified: Secondary | ICD-10-CM

## 2018-04-13 DIAGNOSIS — M5442 Lumbago with sciatica, left side: Secondary | ICD-10-CM

## 2018-04-13 DIAGNOSIS — M5441 Lumbago with sciatica, right side: Secondary | ICD-10-CM

## 2018-04-13 NOTE — Telephone Encounter (Signed)
Sherriton  LMOVM Baldo AshCarl is due for Norco Refill now.

## 2018-04-14 ENCOUNTER — Telehealth (HOSPITAL_BASED_OUTPATIENT_CLINIC_OR_DEPARTMENT_OTHER): Payer: Self-pay | Admitting: Medical

## 2018-04-14 MED ORDER — HYDROCODONE 10 MG-ACETAMINOPHEN 325 MG TABLET
1.0000 | ORAL_TABLET | Freq: Three times a day (TID) | ORAL | 0 refills | Status: AC | PRN
Start: 2018-04-14 — End: 2018-05-14

## 2018-04-14 MED ORDER — LISINOPRIL 20 MG TABLET
20.0000 mg | ORAL_TABLET | Freq: Every day | ORAL | 3 refills | Status: DC
Start: 2018-04-14 — End: 2019-03-29

## 2018-04-14 NOTE — Progress Notes (Signed)
Pt wife called stating that the [facility] She was also [facility]. Norco should be a hard copy Rx correct?

## 2018-04-14 NOTE — Telephone Encounter (Signed)
Pt wife called stating [facility]

## 2018-04-14 NOTE — Telephone Encounter (Signed)
Pt called in to check the status [facility] please advise thanks

## 2018-04-14 NOTE — Telephone Encounter (Signed)
[facility] both are ready for pick up. Called pt wife and adv her both Rx's are ready to go.

## 2018-04-15 NOTE — Telephone Encounter (Signed)
Spoke with Pt and he wishes to see Dr Oren BracketJarka; Dr Leonette MonarchJarka's office accepted Pt's referral. Authorization for pain management injection requested from [employer]'s insurance and is pending.

## 2018-04-15 NOTE — Telephone Encounter (Signed)
FYI: Received authorization for Pt to receive pain management injections from Dr Oren BracketJarka. Referral submitted.

## 2018-04-23 ENCOUNTER — Other Ambulatory Visit (HOSPITAL_BASED_OUTPATIENT_CLINIC_OR_DEPARTMENT_OTHER): Payer: Self-pay | Admitting: Family

## 2018-04-25 NOTE — Telephone Encounter (Signed)
Prescription

## 2018-05-08 ENCOUNTER — Other Ambulatory Visit (HOSPITAL_BASED_OUTPATIENT_CLINIC_OR_DEPARTMENT_OTHER): Payer: Self-pay | Admitting: Medical

## 2018-05-08 DIAGNOSIS — R39198 Other difficulties with micturition: Secondary | ICD-10-CM

## 2018-05-09 ENCOUNTER — Other Ambulatory Visit (HOSPITAL_BASED_OUTPATIENT_CLINIC_OR_DEPARTMENT_OTHER): Payer: Self-pay | Admitting: Medical

## 2018-05-09 DIAGNOSIS — M5126 Other intervertebral disc displacement, lumbar region: Secondary | ICD-10-CM

## 2018-05-09 DIAGNOSIS — M5136 Other intervertebral disc degeneration, lumbar region: Secondary | ICD-10-CM

## 2018-05-09 MED ORDER — IBUPROFEN 800 MG TABLET
800.0000 mg | ORAL_TABLET | Freq: Four times a day (QID) | ORAL | 0 refills | Status: DC | PRN
Start: 2018-05-09 — End: 2018-06-08

## 2018-05-09 NOTE — Telephone Encounter (Signed)
forward

## 2018-05-09 NOTE — Telephone Encounter (Signed)
Requesting a refill on patients prescription    Date of last refill:   03/29/18

## 2018-05-09 NOTE — Telephone Encounter (Signed)
Please call the pt to see if he is still taking tamsulosin. Not on med list. Thanks

## 2018-05-10 NOTE — Telephone Encounter (Signed)
Patient does not need re-fill at this time. He is still taking it, but not daily. He has a full bottle still

## 2018-05-15 ENCOUNTER — Other Ambulatory Visit (HOSPITAL_BASED_OUTPATIENT_CLINIC_OR_DEPARTMENT_OTHER): Payer: Self-pay | Admitting: Medical

## 2018-05-19 ENCOUNTER — Other Ambulatory Visit (HOSPITAL_BASED_OUTPATIENT_CLINIC_OR_DEPARTMENT_OTHER): Payer: Self-pay | Admitting: Medical

## 2018-05-19 MED ORDER — HYDROCODONE 10 MG-ACETAMINOPHEN 325 MG TABLET
1.0000 | ORAL_TABLET | Freq: Three times a day (TID) | ORAL | 0 refills | Status: AC | PRN
Start: 2018-05-19 — End: 2018-06-18

## 2018-05-19 NOTE — Telephone Encounter (Signed)
Pt called back to switch appt but was very specific about needing a 4pm slot. I put pt on Dr. Beckey RutterZhu's schedule to accommodate. Please advise if pt is ok to switch to Dr. Beckey RutterZhu's care, otherwise I will r/s with another provider.

## 2018-05-19 NOTE — Telephone Encounter (Signed)
Yes ok 

## 2018-05-19 NOTE — Telephone Encounter (Signed)
Tell Sheriton it was sent over today.

## 2018-05-19 NOTE — Telephone Encounter (Signed)
Wife called requesting refill LFD 05-14-09~Cures 04-04-18 ~LOV 04-04-18

## 2018-05-20 ENCOUNTER — Ambulatory Visit (HOSPITAL_BASED_OUTPATIENT_CLINIC_OR_DEPARTMENT_OTHER): Payer: Self-pay | Admitting: Cardiovascular Disease

## 2018-05-24 ENCOUNTER — Telehealth (HOSPITAL_BASED_OUTPATIENT_CLINIC_OR_DEPARTMENT_OTHER): Payer: Self-pay | Admitting: Family

## 2018-05-24 NOTE — Telephone Encounter (Signed)
Noted  

## 2018-05-24 NOTE — Telephone Encounter (Signed)
Left message to call back and schedule an appt and that fasting labs were due as well. Letter mailed.

## 2018-05-24 NOTE — Telephone Encounter (Signed)
Please call the pt to schedule a very overdue visit w/Dr.LH. Labs ordered and are fasting. Thanks

## 2018-06-06 ENCOUNTER — Telehealth (HOSPITAL_BASED_OUTPATIENT_CLINIC_OR_DEPARTMENT_OTHER): Payer: Self-pay | Admitting: Medical

## 2018-06-06 NOTE — Telephone Encounter (Signed)
As of April 25, 2018 cpt code [id number] does not require prior-authorization, ref#: [id number], per  Sallye [facility]Sheena [facility].   Authorization for cpt code [id number] received; Pt can reschedule at any time with Dr Oren BracketJarka.

## 2018-06-06 NOTE — Telephone Encounter (Signed)
Patients wife called in they went to get pts epidural with Dr Oren BracketJarka and the authorization was incorrect she would like a call back to discuss please advise

## 2018-06-08 ENCOUNTER — Other Ambulatory Visit (HOSPITAL_BASED_OUTPATIENT_CLINIC_OR_DEPARTMENT_OTHER): Payer: Self-pay | Admitting: Family

## 2018-06-08 ENCOUNTER — Telehealth (HOSPITAL_BASED_OUTPATIENT_CLINIC_OR_DEPARTMENT_OTHER): Payer: Self-pay | Admitting: Medical

## 2018-06-08 DIAGNOSIS — M5136 Other intervertebral disc degeneration, lumbar region: Secondary | ICD-10-CM

## 2018-06-08 DIAGNOSIS — M5126 Other intervertebral disc displacement, lumbar region: Secondary | ICD-10-CM

## 2018-06-08 NOTE — Telephone Encounter (Signed)
Issue has already been cleared with Dr Leonette MonarchJarka's office. Pt can schedule procedure at anytime. Spoke with Josie at Dr Leonette MonarchJarka's yesterday, 08.13.2019, regarding cpt code not needing an authorization and she planned to call Pt ASAP.

## 2018-06-08 NOTE — Telephone Encounter (Signed)
Thank you for looking in to this.

## 2018-06-20 ENCOUNTER — Ambulatory Visit (HOSPITAL_BASED_OUTPATIENT_CLINIC_OR_DEPARTMENT_OTHER): Payer: Self-pay | Admitting: Cardiovascular Disease

## 2018-06-23 ENCOUNTER — Telehealth (HOSPITAL_BASED_OUTPATIENT_CLINIC_OR_DEPARTMENT_OTHER): Payer: Self-pay | Admitting: Medical

## 2018-06-23 ENCOUNTER — Encounter (HOSPITAL_BASED_OUTPATIENT_CLINIC_OR_DEPARTMENT_OTHER): Payer: Self-pay | Admitting: Family Medicine

## 2018-06-23 NOTE — Telephone Encounter (Signed)
Please write note for Dr.Young to sign. Thanks

## 2018-06-23 NOTE — Telephone Encounter (Signed)
GY - if I write this note for you, can you sign on LH's behalf? Please advise. Thanks

## 2018-06-23 NOTE — Telephone Encounter (Signed)
There is no norco on his med list-- just oxycodone???

## 2018-06-23 NOTE — Telephone Encounter (Signed)
Done letter signed pt. To come by and sign release.

## 2018-06-23 NOTE — Telephone Encounter (Signed)
Pts wife called in patient had an accident at work and when he went to the work comp Dr they did a urine dip and he tested positive for opioids due to him being on Norco. In order for him to be able to come back to work they need a letter indicating he is taking the Norco as prescribed and that is why he tested positive. Patient just had an SI fusion surgery and would like to have that indicated as well in the note. Once it is done patient would like it faxed to [phone]. Please advise in LORI HEUSER absence thanks

## 2018-06-23 NOTE — Telephone Encounter (Signed)
Norco is there. LH just filled it in June. Confirmed by pt's wife. I also just updated pt's med list.

## 2018-06-24 ENCOUNTER — Other Ambulatory Visit (HOSPITAL_BASED_OUTPATIENT_CLINIC_OR_DEPARTMENT_OTHER): Payer: Self-pay | Admitting: Medical

## 2018-06-24 DIAGNOSIS — G8929 Other chronic pain: Secondary | ICD-10-CM

## 2018-06-24 DIAGNOSIS — M5442 Lumbago with sciatica, left side: Principal | ICD-10-CM

## 2018-06-24 DIAGNOSIS — M5441 Lumbago with sciatica, right side: Principal | ICD-10-CM

## 2018-06-24 MED ORDER — HYDROCODONE 10 MG-ACETAMINOPHEN 325 MG TABLET
1.0000 | ORAL_TABLET | Freq: Four times a day (QID) | ORAL | 0 refills | Status: AC | PRN
Start: 2018-06-24 — End: 2018-07-24

## 2018-06-24 NOTE — Telephone Encounter (Signed)
[facility]  Last visit 05/05/18  Last fill 05/19/18  No future appt scheduled

## 2018-06-24 NOTE — Telephone Encounter (Signed)
Last cures 06/23/2018 GY

## 2018-07-09 ENCOUNTER — Other Ambulatory Visit (HOSPITAL_BASED_OUTPATIENT_CLINIC_OR_DEPARTMENT_OTHER): Payer: Self-pay | Admitting: Family

## 2018-07-09 DIAGNOSIS — M5136 Other intervertebral disc degeneration, lumbar region: Secondary | ICD-10-CM

## 2018-07-09 DIAGNOSIS — M5126 Other intervertebral disc displacement, lumbar region: Secondary | ICD-10-CM

## 2018-07-09 NOTE — Telephone Encounter (Signed)
ML out of office

## 2018-07-11 ENCOUNTER — Ambulatory Visit (HOSPITAL_BASED_OUTPATIENT_CLINIC_OR_DEPARTMENT_OTHER): Payer: Self-pay | Admitting: Medical

## 2018-07-12 ENCOUNTER — Telehealth (HOSPITAL_BASED_OUTPATIENT_CLINIC_OR_DEPARTMENT_OTHER): Payer: Self-pay | Admitting: Medical

## 2018-07-12 DIAGNOSIS — M5442 Lumbago with sciatica, left side: Principal | ICD-10-CM

## 2018-07-12 DIAGNOSIS — G8929 Other chronic pain: Secondary | ICD-10-CM

## 2018-07-12 DIAGNOSIS — M5441 Lumbago with sciatica, right side: Principal | ICD-10-CM

## 2018-07-13 MED ORDER — HYDROCODONE 10 MG-ACETAMINOPHEN 325 MG TABLET
1.0000 | ORAL_TABLET | Freq: Four times a day (QID) | ORAL | 0 refills | Status: AC | PRN
Start: 2018-07-13 — End: 2018-08-12

## 2018-07-20 ENCOUNTER — Encounter (HOSPITAL_BASED_OUTPATIENT_CLINIC_OR_DEPARTMENT_OTHER): Payer: Self-pay | Admitting: Medical

## 2018-07-20 ENCOUNTER — Ambulatory Visit (HOSPITAL_BASED_OUTPATIENT_CLINIC_OR_DEPARTMENT_OTHER): Payer: Self-pay | Admitting: Medical

## 2018-07-20 VITALS — BP 132/80 | HR 88 | Temp 97.2°F | Resp 14 | Wt 200.0 lb

## 2018-07-20 DIAGNOSIS — M5136 Other intervertebral disc degeneration, lumbar region: Secondary | ICD-10-CM

## 2018-07-20 DIAGNOSIS — G8929 Other chronic pain: Secondary | ICD-10-CM

## 2018-07-20 DIAGNOSIS — M5441 Lumbago with sciatica, right side: Secondary | ICD-10-CM

## 2018-07-20 DIAGNOSIS — M5442 Lumbago with sciatica, left side: Secondary | ICD-10-CM

## 2018-07-21 NOTE — Progress Notes (Signed)
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  He is also due for a CSV for Norco. He has gotten off muscle relaxants, daily use Motrin. He is down to two Norco daily. He states for now this is sufficient. He is working.    Review of Systems   Constitutional: Negative.    Musculoskeletal: Positive for arthralgias and back pain.   Psychiatric/Behavioral: Negative.      Physical Exam   Constitutional: He appears well-developed and well-nourished. No distress.   Musculoskeletal:   Wrist not evaluated   Neurological: He is alert.   Psychiatric: He has a normal mood and affect.   Vitals reviewed.    1. Chronic bilateral low back pain with bilateral sciatica  He is not due for a refill at this time. CURES was run last month    2. DDD (degenerative disc disease), lumbar  Encouraged weaning off Norco is able. FU in 3 months for a CSV

## 2018-07-25 ENCOUNTER — Other Ambulatory Visit (HOSPITAL_BASED_OUTPATIENT_CLINIC_OR_DEPARTMENT_OTHER): Payer: Self-pay | Admitting: Medical

## 2018-07-25 DIAGNOSIS — Z13220 Encounter for screening for lipoid disorders: Secondary | ICD-10-CM

## 2018-07-25 MED ORDER — ACYCLOVIR 400 MG TABLET
400.0000 mg | ORAL_TABLET | Freq: Two times a day (BID) | ORAL | 0 refills | Status: DC
Start: 2018-07-25 — End: 2018-09-04

## 2018-07-25 NOTE — Telephone Encounter (Signed)
LFD~10-25-17

## 2018-07-25 NOTE — Progress Notes (Signed)
Done wife advised

## 2018-08-19 ENCOUNTER — Other Ambulatory Visit (HOSPITAL_BASED_OUTPATIENT_CLINIC_OR_DEPARTMENT_OTHER): Payer: Self-pay | Admitting: Medical

## 2018-08-19 NOTE — Telephone Encounter (Signed)
lmovm requesting refill LFD~ 07-13-18. Aware LH out of the office.

## 2018-08-22 MED ORDER — HYDROCODONE 10 MG-ACETAMINOPHEN 325 MG TABLET
1.0000 | ORAL_TABLET | Freq: Four times a day (QID) | ORAL | 0 refills | Status: AC | PRN
Start: 2018-08-22 — End: 2018-09-21

## 2018-09-04 ENCOUNTER — Other Ambulatory Visit: Attending: Family Medicine

## 2018-09-04 ENCOUNTER — Other Ambulatory Visit (HOSPITAL_BASED_OUTPATIENT_CLINIC_OR_DEPARTMENT_OTHER): Payer: Self-pay | Admitting: Medical

## 2018-09-04 DIAGNOSIS — Z13220 Encounter for screening for lipoid disorders: Secondary | ICD-10-CM | POA: Insufficient documentation

## 2018-09-04 LAB — LIPID PANEL
Cholesterol: 238 mg/dL — ABNORMAL HIGH (ref 112–200)
HDL Cholesterol: 55 mg/dL (ref >=40)
LDL Cholesterol Calculation: 169 mg/dL — ABNORMAL HIGH (ref <100)
Non-HDL Cholesterol: 183 mg/dL — ABNORMAL HIGH (ref <=160.0)
Total Cholesterol: HDL Ratio: 4.3 mg/dL (ref 2.0–5.0)
Triglyceride: 70 mg/dL (ref 30–150)

## 2018-09-05 NOTE — Progress Notes (Signed)
Has appt will discuss then

## 2018-09-28 ENCOUNTER — Other Ambulatory Visit (HOSPITAL_BASED_OUTPATIENT_CLINIC_OR_DEPARTMENT_OTHER): Payer: Self-pay | Admitting: Medical

## 2018-09-28 MED ORDER — HYDROCODONE 10 MG-ACETAMINOPHEN 325 MG TABLET
1.0000 | ORAL_TABLET | Freq: Four times a day (QID) | ORAL | 0 refills | Status: DC | PRN
Start: 2018-09-28 — End: 2018-10-24

## 2018-09-28 NOTE — Telephone Encounter (Signed)
lmovm requesting Hydrocodone refill. LFD 08-22-18

## 2018-10-03 ENCOUNTER — Other Ambulatory Visit (HOSPITAL_BASED_OUTPATIENT_CLINIC_OR_DEPARTMENT_OTHER): Payer: Self-pay | Admitting: Medical

## 2018-10-03 DIAGNOSIS — M5126 Other intervertebral disc displacement, lumbar region: Secondary | ICD-10-CM

## 2018-10-03 DIAGNOSIS — M5136 Other intervertebral disc degeneration, lumbar region: Secondary | ICD-10-CM

## 2018-10-04 NOTE — Telephone Encounter (Signed)
pts wife called in to check the status can you please send this over

## 2018-10-17 ENCOUNTER — Ambulatory Visit (HOSPITAL_BASED_OUTPATIENT_CLINIC_OR_DEPARTMENT_OTHER): Payer: Self-pay | Admitting: Medical

## 2018-10-24 ENCOUNTER — Encounter (HOSPITAL_BASED_OUTPATIENT_CLINIC_OR_DEPARTMENT_OTHER): Payer: Self-pay | Admitting: Medical

## 2018-10-24 ENCOUNTER — Ambulatory Visit (HOSPITAL_BASED_OUTPATIENT_CLINIC_OR_DEPARTMENT_OTHER): Payer: Self-pay | Admitting: Medical

## 2018-10-24 VITALS — BP 102/70 | HR 61 | Temp 97.6°F | Resp 12 | Wt 202.0 lb

## 2018-10-24 DIAGNOSIS — S6991XD Unspecified injury of right wrist, hand and finger(s), subsequent encounter: Secondary | ICD-10-CM

## 2018-10-24 DIAGNOSIS — M5441 Lumbago with sciatica, right side: Secondary | ICD-10-CM

## 2018-10-24 DIAGNOSIS — R3912 Poor urinary stream: Secondary | ICD-10-CM

## 2018-10-24 DIAGNOSIS — M5430 Sciatica, unspecified side: Secondary | ICD-10-CM

## 2018-10-24 DIAGNOSIS — M5442 Lumbago with sciatica, left side: Secondary | ICD-10-CM

## 2018-10-24 DIAGNOSIS — G8929 Other chronic pain: Secondary | ICD-10-CM

## 2018-10-24 MED ORDER — TAMSULOSIN 0.4 MG CAPSULE
0.4000 mg | ORAL_CAPSULE | Freq: Every day | ORAL | 1 refills | Status: DC
Start: 2018-10-24 — End: 2018-12-19

## 2018-10-24 MED ORDER — HYDROCODONE 10 MG-ACETAMINOPHEN 325 MG TABLET
1.0000 | ORAL_TABLET | Freq: Four times a day (QID) | ORAL | 0 refills | Status: DC | PRN
Start: 2018-10-24 — End: 2018-11-22

## 2018-10-24 NOTE — Progress Notes (Signed)
52 year old male presents today for his CSV for Norco. He has chronic low back pain and for the last 3 months has had a wrist injury. He had [facility] do surgery on it in November so tomorrow he may get his cast off. He has been in pain with both conditions. He states he takes about 3-4 Norco daily. He is not currently working. He is requesting increasing the number to #90 monthly for 3 months then hopefully he might be able to drastically reduce the amount.  He also notes his urinary stream is lessened and it takes awhile to get urinating. Flomax helped in the past and he requests this again for the short term.    Review of Systems   Constitutional: Negative.    Genitourinary: Positive for decreased urine volume and difficulty urinating. Negative for enuresis, frequency, hematuria and urgency.   Musculoskeletal: Positive for arthralgias, back pain and myalgias.   Psychiatric/Behavioral: Negative.      Physical Exam  Vitals signs reviewed.   Constitutional:       Appearance: Normal appearance.   Musculoskeletal:      Comments: Casted right wrist/arm   Neurological:      General: No focal deficit present.      Mental Status: He is alert.   Psychiatric:         Mood and Affect: Mood normal.     1. Chronic bilateral low back pain with bilateral sciatica  CURES run  - Hydrocodone 10 mg/Acetaminophen 325 mg (NORCO) Tablet; Take 1 tablet by mouth every 6 hours if needed.  Dispense: 90 tablet; Refill: 0    2. Sciatica, unspecified laterality  - Hydrocodone 10 mg/Acetaminophen 325 mg (NORCO) Tablet; Take 1 tablet by mouth every 6 hours if needed.  Dispense: 90 tablet; Refill: 0    3. Injury of right wrist, subsequent encounter  Follow up with Orthopedist  - Hydrocodone 10 mg/Acetaminophen 325 mg (NORCO) Tablet; Take 1 tablet by mouth every 6 hours if needed.  Dispense: 90 tablet; Refill: 0    4. Poor urinary stream  - Tamsulosin (FLOMAX) 0.4 mg Capsule; Take 1 capsule by mouth every day. Take 30 minutes after meals  at the same times each day.  Dispense: 30 capsule; Refill: 1

## 2018-11-14 ENCOUNTER — Other Ambulatory Visit (HOSPITAL_BASED_OUTPATIENT_CLINIC_OR_DEPARTMENT_OTHER): Payer: Self-pay | Admitting: Family

## 2018-11-14 DIAGNOSIS — M5136 Other intervertebral disc degeneration, lumbar region: Secondary | ICD-10-CM

## 2018-11-14 DIAGNOSIS — M5126 Other intervertebral disc displacement, lumbar region: Secondary | ICD-10-CM

## 2018-11-14 NOTE — Telephone Encounter (Signed)
LV checked   LRD checked  Labs reviewed   Pended for Sig

## 2018-11-22 ENCOUNTER — Other Ambulatory Visit (HOSPITAL_BASED_OUTPATIENT_CLINIC_OR_DEPARTMENT_OTHER): Payer: Self-pay | Admitting: Medical

## 2018-11-22 DIAGNOSIS — G8929 Other chronic pain: Secondary | ICD-10-CM

## 2018-11-22 DIAGNOSIS — S6991XD Unspecified injury of right wrist, hand and finger(s), subsequent encounter: Secondary | ICD-10-CM

## 2018-11-22 DIAGNOSIS — M5442 Lumbago with sciatica, left side: Principal | ICD-10-CM

## 2018-11-22 DIAGNOSIS — M5430 Sciatica, unspecified side: Secondary | ICD-10-CM

## 2018-11-22 DIAGNOSIS — M5441 Lumbago with sciatica, right side: Principal | ICD-10-CM

## 2018-11-22 MED ORDER — HYDROCODONE 10 MG-ACETAMINOPHEN 325 MG TABLET
1.0000 | ORAL_TABLET | Freq: Four times a day (QID) | ORAL | 0 refills | Status: DC | PRN
Start: 2018-11-22 — End: 2019-01-03

## 2018-11-22 NOTE — Telephone Encounter (Signed)
Last cures and last csv [facility] 10/24/2018

## 2018-11-22 NOTE — Telephone Encounter (Signed)
Last OV: 10/24/18  Last refill : 10/24/18

## 2018-11-22 NOTE — Telephone Encounter (Signed)
Cures run

## 2018-12-18 ENCOUNTER — Other Ambulatory Visit (HOSPITAL_BASED_OUTPATIENT_CLINIC_OR_DEPARTMENT_OTHER): Payer: Self-pay | Admitting: Medical

## 2018-12-18 DIAGNOSIS — R3912 Poor urinary stream: Secondary | ICD-10-CM

## 2018-12-19 NOTE — Telephone Encounter (Signed)
LOV 12.30.19  Next OV 03.25.20

## 2019-01-03 ENCOUNTER — Other Ambulatory Visit (HOSPITAL_BASED_OUTPATIENT_CLINIC_OR_DEPARTMENT_OTHER): Payer: Self-pay | Admitting: Medical

## 2019-01-03 DIAGNOSIS — M5442 Lumbago with sciatica, left side: Principal | ICD-10-CM

## 2019-01-03 DIAGNOSIS — M5441 Lumbago with sciatica, right side: Principal | ICD-10-CM

## 2019-01-03 DIAGNOSIS — G8929 Other chronic pain: Secondary | ICD-10-CM

## 2019-01-03 DIAGNOSIS — M5430 Sciatica, unspecified side: Secondary | ICD-10-CM

## 2019-01-03 DIAGNOSIS — S6991XD Unspecified injury of right wrist, hand and finger(s), subsequent encounter: Secondary | ICD-10-CM

## 2019-01-03 NOTE — Telephone Encounter (Signed)
Requesting a refill on patients prescription    Date of last refill:   11/22/18  Pharmacy verified/updated if needed? yes

## 2019-01-04 MED ORDER — HYDROCODONE 10 MG-ACETAMINOPHEN 325 MG TABLET
1.0000 | ORAL_TABLET | Freq: Four times a day (QID) | ORAL | 0 refills | Status: AC | PRN
Start: 2019-01-04 — End: 2019-02-03

## 2019-01-04 NOTE — Telephone Encounter (Signed)
patient cxd appointment tomorrow and is over the 3 month mark. 1 month pended for review. The patient did r/s to 02/23/2019.

## 2019-01-04 NOTE — Telephone Encounter (Signed)
Refill Guidance:  Typical Refill: 30 days / 0 refills  Schedule appointment before sending request if last visit over 3 months ago  @rfrules @   Naloxone previous prescribed:  no  Last CSA date: 10/24/18  Recent Visits  Date Type Provider Dept   10/24/18 Office Visit [facility]iezer Bottom[facility]   07/20/18 Office Visit Eliezer Bottom, [place] [facility] [place] [facility]   Showing recent visits within past 180 days with a meds authorizing provider and meeting all other requirements     Future Appointments  Date Type Provider Dept   01/05/19 Appointment Duayne Cal, [facility], [facility]   Showing future appointments within next 180 days with a meds authorizing provider and meeting all other requirements         CURES Aud[address]         User Date Status   Barbee Cough, [place], [place], [place], [place] 09/22/2017  5:05 PM  02/03/2018 10:13 AM  06/24/2018  4:40 PM  07/13/2018  3:55 PM  11/22/2018  4:58 PM Reviewed PDMP [1]  Reviewed PDMP [1]  Reviewed PDMP [1]  Reviewed PDMP [1]  Reviewed PDMP [1]

## 2019-01-05 ENCOUNTER — Ambulatory Visit (HOSPITAL_BASED_OUTPATIENT_CLINIC_OR_DEPARTMENT_OTHER): Payer: Self-pay | Admitting: Medical

## 2019-01-14 ENCOUNTER — Other Ambulatory Visit (HOSPITAL_BASED_OUTPATIENT_CLINIC_OR_DEPARTMENT_OTHER): Payer: Self-pay | Admitting: Medical

## 2019-01-14 DIAGNOSIS — R3912 Poor urinary stream: Secondary | ICD-10-CM

## 2019-01-16 ENCOUNTER — Other Ambulatory Visit (HOSPITAL_BASED_OUTPATIENT_CLINIC_OR_DEPARTMENT_OTHER): Payer: Self-pay | Admitting: Family

## 2019-01-16 NOTE — Telephone Encounter (Signed)
Please verify correct pharmacy  Please ensure 90 day supply for mail order pharmacies.  Please verify prescription days equals quantity x refills      BP Readings from Last 1 Encounters:   10/24/18 102/70      Pulse Readings from Last 1 Encounters:   10/24/18 61     Lab Results   Component Value Date    WBC 8.3 11/01/2017    HGB 13.4 11/01/2017    PLT 166 11/01/2017    NA 137 11/01/2017    K 4.8 11/01/2017    GLU 101 (H) 11/01/2017    CR 0.92 11/01/2017    ALT 33 11/01/2017     Recent Visits  Date Type Provider Dept   10/24/18 Office Visit Heuser, Lori S, [facility]   07/20/18 Office Visit [facility]user, Lori S[facility]   Showing recent visits within past 180 days with a meds authorizing provider and meeting all other requirements     Future Appointments  Date Type Provider Dept   02/23/19 Appointment [facility]user, Lori S[facility]   Showing future appointments within next 180 days with a meds authorizing provider and meeting all other requirements         CURES Audit Trail         User Date Status   HEUSER, LORI  HEUSER, LORI  YOUNG, GARY  HEUSER, LORI  YOUNG, GARY  HEUSER, LORI 09/22/2017  5:05 PM  02/03/2018 10:13 AM  06/24/2018  4:40 PM  07/13/2018  3:55 PM  11/22/2018  4:58 PM  01/04/2019 12:00 PM Reviewed PDMP [1]  Reviewed PDMP [1]  Reviewed PDMP [1]  Reviewed PDMP [1]  Reviewed PDMP [1]  Reviewed PDMP [1]             Current Medications  has a current medication list which includes the following prescription(s): acyclovir, hydrocodone 10 mg/acetaminophen 325 mg, lisinopril, tamsulosin, and tens unit and electrodes.

## 2019-01-16 NOTE — Telephone Encounter (Signed)
Please verify correct pharmacy  Please ensure 90 day supply for mail order pharmacies.  Please verify prescription days equals quantity x refills      BP Readings from Last 1 Encounters:   10/24/18 102/70      Pulse Readings from Last 1 Encounters:   10/24/18 61     Lab Results   Component Value Date    WBC 8.3 11/01/2017    HGB 13.4 11/01/2017    PLT 166 11/01/2017    NA 137 11/01/2017    K 4.8 11/01/2017    GLU 101 (H) 11/01/2017    CR 0.92 11/01/2017    ALT 33 11/01/2017     Recent Visits  Date Type Provider Dept   10/24/18 Office Visit Eliezer Bottom, [facility] [place] [facility] [facility]   07/20/18 Office Visit [facility]iezer Bottom[facility]   Showing recent visits within past 180 days with a meds authorizing provider and meeting all other requirements     Future Appointments  Date Type Provider Dept   02/23/19 Appointment Duayne Cal, [facility]orelei [place], [facility]   Showing future appointments within next 180 days with a meds authorizing provider and meeting all other requirements         CURES Audit Trail         User Date Status   [place], LORI  [place], [place], [place], [place], [place], [place] 09/22/2017  5:05 PM  02/03/2018 10:13 AM  06/24/2018  4:40 PM  07/13/2018  3:55 PM  11/22/2018  4:58 PM  01/04/2019 12:00 PM Reviewed PDMP [1]  Reviewed PDMP [1]  Reviewed PDMP [1]  Reviewed PDMP [1]  Reviewed PDMP [1]  Reviewed PDMP [1]             Current Medications  has a current medication list which includes the following prescription(s): acyclovir, hydrocodone 10 mg/acetaminophen 325 mg, lisinopril, tamsulosin, and tens unit and electrodes.

## 2019-01-16 NOTE — Telephone Encounter (Signed)
Please schedule cpe with [facility]

## 2019-01-17 NOTE — Telephone Encounter (Signed)
Pt has CSV with [facility] 02/23/19.

## 2019-01-18 ENCOUNTER — Ambulatory Visit (HOSPITAL_BASED_OUTPATIENT_CLINIC_OR_DEPARTMENT_OTHER): Payer: Self-pay | Admitting: Medical

## 2019-01-21 ENCOUNTER — Other Ambulatory Visit (HOSPITAL_BASED_OUTPATIENT_CLINIC_OR_DEPARTMENT_OTHER): Payer: Self-pay | Admitting: Family Medicine

## 2019-01-23 NOTE — Telephone Encounter (Signed)
Please verify correct pharmacy  Please ensure 90 day supply for mail order pharmacies.  Please verify prescription days equals quantity x refills      BP Readings from Last 1 Encounters:   10/24/18 102/70      Pulse Readings from Last 1 Encounters:   10/24/18 61     Lab Results   Component Value Date    WBC 8.3 11/01/2017    HGB 13.4 11/01/2017    PLT 166 11/01/2017    NA 137 11/01/2017    K 4.8 11/01/2017    GLU 101 (H) 11/01/2017    CR 0.92 11/01/2017    ALT 33 11/01/2017     Recent Visits  Date Type Provider Dept   10/24/18 Office Visit Eliezer Bottom, [facility] P[facility]   Showing recent visits within past 180 days with a meds authorizing provider and meeting all other requirements     Future Appointments  Date Type Provider Dept   02/23/19 Appointment Duayne Cal, Lorelei Pont, PA P[facility]   Showing future appointments within next 180 days with a meds authorizing provider and meeting all other requirements         CURES Audit [address]         User Date Status   [place], LORI  [place], [place], [place], [place], [place], [place] 09/22/2017  5:05 PM  02/03/2018 10:13 AM  06/24/2018  4:40 PM  07/13/2018  3:55 PM  11/22/2018  4:58 PM  01/04/2019 12:00 PM Reviewed PDMP [1]  Reviewed PDMP [1]  Reviewed PDMP [1]  Reviewed PDMP [1]  Reviewed PDMP [1]  Reviewed PDMP [1]             Current Medications  has a current medication list which includes the following prescription(s): acyclovir, hydrocodone 10 mg/acetaminophen 325 mg, lisinopril, tamsulosin, and tens unit and electrodes.

## 2019-02-16 ENCOUNTER — Encounter (HOSPITAL_BASED_OUTPATIENT_CLINIC_OR_DEPARTMENT_OTHER): Payer: Self-pay | Admitting: Medical

## 2019-02-16 DIAGNOSIS — M533 Sacrococcygeal disorders, not elsewhere classified: Secondary | ICD-10-CM

## 2019-02-16 NOTE — Telephone Encounter (Signed)
From: Orvan July  To: Eliezer Bottom, PA  Sent: 02/16/2019 11:49 AM PDT  Subject: Referral Question    Hi [name]  our apt is on phone on 4/30. I will be at work to you so needed reach out to you now i need please. my time will be limited and I need get in soon the pain down. my tail bone pain is back and bad! I need another referral to Dr .Oren Bracket they will do another one the staff said. its been a year I had 2 of them in 2019 and the pain was gone!! well its returned. can you have your staff call [facility] when the referral has been sent she will then get ahold of Josie @ Dr. Oren Bracket get me in for the injections in my tail bone asap! Thank you I

## 2019-02-23 ENCOUNTER — Ambulatory Visit (HOSPITAL_BASED_OUTPATIENT_CLINIC_OR_DEPARTMENT_OTHER): Payer: 59 | Admitting: Medical

## 2019-02-23 DIAGNOSIS — F1190 Opioid use, unspecified, uncomplicated: Secondary | ICD-10-CM

## 2019-02-23 DIAGNOSIS — M5136 Other intervertebral disc degeneration, lumbar region: Secondary | ICD-10-CM

## 2019-02-23 DIAGNOSIS — M533 Sacrococcygeal disorders, not elsewhere classified: Secondary | ICD-10-CM

## 2019-02-23 MED ORDER — HYDROCODONE 10 MG-ACETAMINOPHEN 325 MG TABLET
1.0000 | ORAL_TABLET | Freq: Four times a day (QID) | ORAL | 0 refills | Status: DC | PRN
Start: 2019-02-23 — End: 2019-03-28

## 2019-02-23 NOTE — Progress Notes (Signed)
Due to infection control restrictions and shelter in place order in [place], patient is receiving services via telephone visit.    Verbal consent to telephone visit as an acceptable modality for delivery of healthcare services was obtained. yes    Minutes spent in patient initiated communication. 7    Phone visit for a CSV for Norco. He had an epidural yesterday for the coccyx pain. This has greatly helped him in the past. Today he doesn't see any difference but over the next few days hopefully he will. He uses 1-3 Norco daily depending on what he is doing or has recently done to exacerbate the pain. Overall he is happy with his pain control.   He will need to complete another CSA with the medication amount changes and the pharmacy change at the next visit. We will order a Utox.    1. DDD (degenerative disc disease), lumbar  - UR DRUGS OF ABUSE SCREEN; Future  - Hydrocodone 10 mg/Acetaminophen 325 mg (NORCO) Tablet; Take 1 tablet by mouth every 6 hours if needed.  Dispense: 90 tablet; Refill: 0    2. Coccydynia  - UR DRUGS OF ABUSE SCREEN; Future  - Hydrocodone 10 mg/Acetaminophen 325 mg (NORCO) Tablet; Take 1 tablet by mouth every 6 hours if needed.  Dispense: 90 tablet; Refill: 0    3. Chronic narcotic use  CURES run today, CSA next visit, non opioid form complete too  - UR DRUGS OF ABUSE SCREEN; Future

## 2019-03-06 ENCOUNTER — Encounter (HOSPITAL_BASED_OUTPATIENT_CLINIC_OR_DEPARTMENT_OTHER): Payer: Self-pay | Admitting: Medical

## 2019-03-06 ENCOUNTER — Telehealth (HOSPITAL_BASED_OUTPATIENT_CLINIC_OR_DEPARTMENT_OTHER): Payer: Self-pay | Admitting: Medical

## 2019-03-06 DIAGNOSIS — M533 Sacrococcygeal disorders, not elsewhere classified: Secondary | ICD-10-CM

## 2019-03-06 NOTE — Telephone Encounter (Signed)
Patient's wife informed

## 2019-03-06 NOTE — Telephone Encounter (Signed)
From: Orvan July  To: Eliezer Bottom, PA  Sent: 03/06/2019 11:51 AM PDT  Subject: Referral Question    HI--- [name] Its [facility]--- I had your team send you a message for Lisa another referral tail bone injection--   ]Tail Bone --"Coccygeal code 64450"] Dr. Oren Bracket just called and since the 1st injection 2 weeks ago not help yet and last year it Took a few To get the terrible pain!! down .. he wants to do another injection" ASAP" help Sidharth his pain level and Friday they have an opening and Jayvonte is off that day... Bannon Is it approved and done next week he is heading out of town a job 7 days week 12 hour shifts for 14 days .Marland Kitchen They just need the referral sent to [facility] @ Dr. Leonette Monarch office [phone]... THANK YOU hope all is well with you sending blessings your way alwaysss :) :)

## 2019-03-06 NOTE — Telephone Encounter (Signed)
Patient's wife called in the patient needs another referral sent to [facility] for an epidural coccygeal inj. Code is [id number] patient had one done and it didn't work will need to have a urgent referral sent over. Josi with the surgery center is waiting on the referral would like to put him on the schedule on Friday please advise

## 2019-03-10 ENCOUNTER — Other Ambulatory Visit (HOSPITAL_BASED_OUTPATIENT_CLINIC_OR_DEPARTMENT_OTHER): Payer: Self-pay | Admitting: Medical

## 2019-03-10 NOTE — Telephone Encounter (Signed)
Please verify correct pharmacy  Please ensure 90 day supply for mail order pharmacies.  Please verify prescription days equals quantity x refills      BP Readings from Last 1 Encounters:   10/24/18 102/70      Pulse Readings from Last 1 Encounters:   10/24/18 61     Lab Results   Component Value Date    WBC 8.3 11/01/2017    HGB 13.4 11/01/2017    PLT 166 11/01/2017    NA 137 11/01/2017    K 4.8 11/01/2017    GLU 101 (H) 11/01/2017    CR 0.92 11/01/2017    ALT 33 11/01/2017     Recent Visits  Date Type Provider Dept   02/23/19 Office Visit Eliezer Bottom, [facility] P[facility]   10/24/18 Office Visit Eliezer Bottom, [place] [facility]   Showing recent visits within past 180 days with a meds authorizing provider and meeting all other requirements     Future Appointments  Date Type Provider Dept   05/25/19 Appointment Duayne Cal, Lorelei Pont, [facility]   Showing future appointments within next 180 days with a meds authorizing provider and meeting all other requirements         CURES Audit Trail         User Date Status   [place], LORI  [place], [place], [place], [place], [place], [place] 09/22/2017  5:05 PM  02/03/2018 10:13 AM  06/24/2018  4:40 PM  07/13/2018  3:55 PM  11/22/2018  4:58 PM  01/04/2019 12:00 PM Reviewed PDMP [1]  Reviewed PDMP [1]  Reviewed PDMP [1]  Reviewed PDMP [1]  Reviewed PDMP [1]  Reviewed PDMP [1]             Current Medications  has a current medication list which includes the following prescription(s): acyclovir, hydrocodone 10 mg/acetaminophen 325 mg, lisinopril, tamsulosin, and tens unit and electrodes.

## 2019-03-10 NOTE — Telephone Encounter (Signed)
How many does he take at each time?  Can do larger size dose.

## 2019-03-10 NOTE — Telephone Encounter (Signed)
lmtcb

## 2019-03-13 ENCOUNTER — Encounter (HOSPITAL_BASED_OUTPATIENT_CLINIC_OR_DEPARTMENT_OTHER): Payer: Self-pay | Admitting: Medical

## 2019-03-13 ENCOUNTER — Other Ambulatory Visit (HOSPITAL_BASED_OUTPATIENT_CLINIC_OR_DEPARTMENT_OTHER): Payer: Self-pay | Admitting: Medical

## 2019-03-13 DIAGNOSIS — E291 Testicular hypofunction: Secondary | ICD-10-CM

## 2019-03-13 MED ORDER — TESTOSTERONE 1 % (50 MG/5 GRAM) TRANSDERMAL GEL PACKET
1.0000 | Freq: Every day | TRANSDERMAL | 0 refills | Status: AC
Start: 2019-03-13 — End: 2019-04-12

## 2019-03-13 NOTE — Telephone Encounter (Signed)
Re-fill previously pended

## 2019-03-13 NOTE — Telephone Encounter (Signed)
Please see Mychart as well

## 2019-03-13 NOTE — Telephone Encounter (Signed)
forward

## 2019-03-13 NOTE — Telephone Encounter (Signed)
From: Orvan July  To: Eliezer Bottom, PA  Sent: 03/10/2019 5:21 PM PDT  Subject: MyChart Refill Request    Hi [name] i called back staff th that called prescription questions on hold 30 mins 515 call disconnect..ugh!! Think about my 'sildenafil 20mg  ' pill i take daily.. needed refill please ...[facility] this am  Tkd [place]

## 2019-03-15 ENCOUNTER — Other Ambulatory Visit (HOSPITAL_BASED_OUTPATIENT_CLINIC_OR_DEPARTMENT_OTHER): Payer: Self-pay | Admitting: Medical

## 2019-03-15 ENCOUNTER — Telehealth (HOSPITAL_BASED_OUTPATIENT_CLINIC_OR_DEPARTMENT_OTHER): Payer: Self-pay | Admitting: Medical

## 2019-03-15 DIAGNOSIS — E291 Testicular hypofunction: Secondary | ICD-10-CM

## 2019-03-15 NOTE — Telephone Encounter (Signed)
Patient's significant other called in requesting to have a lab order to check his testosterone levels please advise

## 2019-03-15 NOTE — Telephone Encounter (Signed)
I can order testosterone again if he hasn't started the prescription. If he started it, then we can recheck in 3-6 months

## 2019-03-17 NOTE — Telephone Encounter (Signed)
Spoke with significant other, she has picked up the Rx but he hasnt started taking it yet. She wants to talk to him and see if he is willing to get the labs done before starting the Rx. She is sending over some paper work that needs to be filled out

## 2019-03-28 ENCOUNTER — Other Ambulatory Visit (HOSPITAL_BASED_OUTPATIENT_CLINIC_OR_DEPARTMENT_OTHER): Payer: Self-pay | Admitting: Medical

## 2019-03-28 DIAGNOSIS — M5136 Other intervertebral disc degeneration, lumbar region: Secondary | ICD-10-CM

## 2019-03-28 DIAGNOSIS — M533 Sacrococcygeal disorders, not elsewhere classified: Secondary | ICD-10-CM

## 2019-03-28 NOTE — Telephone Encounter (Signed)
Requesting a refill on patients prescription    Date of last refill:   02/23/19  Pharmacy verified/updated if needed? yes

## 2019-03-28 NOTE — Telephone Encounter (Signed)
Refill Guidance:  Typical Refill: 30 days / 0 refills  Schedule appointment before sending request if last visit over 3 months ago    Please verify correct pharmacy  Please ensure 90 day supply for mail order pharmacies.  Please verify prescription days equals quantity x refills     LF: 02/23/19     Naloxone previous prescribed:  no  Last CSA date: NA  Recent Visits  Date Type Provider Dept   02/23/19 Office Visit Eliezer Bottom, [facility] P[facility]   10/24/18 Office Visit Eliezer Bottom, [place] [facility] [place] [facility]   Showing recent visits within past 180 days with a meds authorizing provider and meeting all other requirements     Future Appointments  Date Type Provider Dept   05/25/19 Appointment Duayne Cal, Lorelei Pont, [facility]   Showing future appointments within next 180 days with a meds authorizing provider and meeting all other requirements         CURES Audit Trail         User Date Status   [place], [place], [place], [place], [place], [place], [place] 09/22/2017  5:05 PM  02/03/2018 10:13 AM  06/24/2018  4:40 PM  07/13/2018  3:55 PM  11/22/2018  4:58 PM  01/04/2019 12:00 PM Reviewed PDMP [1]  Reviewed PDMP [1]  Reviewed PDMP [1]  Reviewed PDMP [1]  Reviewed PDMP [1]  Reviewed PDMP [1]

## 2019-03-29 ENCOUNTER — Other Ambulatory Visit (HOSPITAL_BASED_OUTPATIENT_CLINIC_OR_DEPARTMENT_OTHER): Payer: Self-pay | Admitting: Medical

## 2019-03-29 DIAGNOSIS — I10 Essential (primary) hypertension: Secondary | ICD-10-CM

## 2019-03-29 MED ORDER — HYDROCODONE 10 MG-ACETAMINOPHEN 325 MG TABLET
1.0000 | ORAL_TABLET | Freq: Four times a day (QID) | ORAL | 0 refills | Status: DC | PRN
Start: 2019-03-29 — End: 2019-04-25

## 2019-03-29 NOTE — Telephone Encounter (Signed)
Refill Guidance:  Typical Refill: 90 days / 3 refills  Schedule appointment before sending request if last visit over 6 months ago, or if more than 1 month past due per last progress notes    Please verify correct pharmacy  Please ensure 90 day supply for mail order pharmacies.  Please verify prescription days equals quantity x refills     BP Readings from Last 3 Encounters:   10/24/18 102/70   07/20/18 132/80   04/04/18 130/80     Lab Results   Component Value Date    CR 0.92 11/01/2017    LDLC 169 (H) 09/04/2018      Recent Visits  Date Type Provider Dept   02/23/19 Office Visit Eliezer Bottom, PA P[facility]   10/24/18 Office Visit Eliezer Bottom, [place] [facility]   Showing recent visits within past 180 days with a meds authorizing provider and meeting all other requirements     Future Appointments  Date Type Provider Dept   05/25/19 Appointment Duayne Cal, Lorelei Pont, [facility]   Showing future appointments within next 180 days with a meds authorizing provider and meeting all other requirements      BP Medications in Epic (for confirmation)  Hypertensive Medication             Lisinopril (PRINIVIL, ZESTRIL) 20 mg Tablet Take 1 tablet by mouth every day.

## 2019-04-25 ENCOUNTER — Other Ambulatory Visit (HOSPITAL_BASED_OUTPATIENT_CLINIC_OR_DEPARTMENT_OTHER): Payer: Self-pay | Admitting: Medical

## 2019-04-25 DIAGNOSIS — M533 Sacrococcygeal disorders, not elsewhere classified: Secondary | ICD-10-CM

## 2019-04-25 DIAGNOSIS — M5136 Other intervertebral disc degeneration, lumbar region: Secondary | ICD-10-CM

## 2019-04-25 MED ORDER — HYDROCODONE 10 MG-ACETAMINOPHEN 325 MG TABLET
1.0000 | ORAL_TABLET | Freq: Four times a day (QID) | ORAL | 0 refills | Status: DC | PRN
Start: 2019-04-26 — End: 2019-05-23

## 2019-04-25 NOTE — Telephone Encounter (Signed)
Refill Guidance:  Typical Refill: 30 days / 0 refills  Schedule appointment before sending request if last visit over 3 months ago    Please verify correct pharmacy  Please ensure 90 day supply for mail order pharmacies.  Please verify prescription days equals quantity x refills     LF 03/29/19     Naloxone previous prescribed:  no  Last CSA date: NA  Recent Visits  Date Type Provider Dept   02/23/19 Office Visit Octavia Bruckner, PA Pc [facility] [place] recent visits within past 180 days with a meds authorizing provider and meeting all other requirements     Future Appointments  Date Type Provider Dept   05/25/19 Appointment Frazier Richards, Winfred Leeds, PA Pc [facility] [place] future appointments within next 180 days with a meds authorizing provider and meeting all other requirements         CURES Aud[address] Trail         User Date Status   [place], [place], [place], [place], [place], [place], [place] 09/22/2017  5:05 PM  02/03/2018 10:13 AM  06/24/2018  4:40 PM  07/13/2018  3:55 PM  11/22/2018  4:58 PM  01/04/2019 12:00 PM Reviewed PDMP [1]  Reviewed PDMP [1]  Reviewed PDMP [1]  Reviewed PDMP [1]  Reviewed PDMP [1]  Reviewed PDMP [1]             Pain Medication       Disp Refills Start End    Hydrocodone 10 mg/Acetaminophen 325 mg (NORCO) Tablet 90 tablet 0 03/29/2019 04/28/2019    Sig - Route: Take 1 tablet by mouth every 6 hours if needed. - ORAL    Class: Pharmacy    Earliest Fill Date: 03/29/2019    Cosign for Ordering: Accepted by Theodora Blow On, MD on 03/29/2019  8:47 AM

## 2019-04-25 NOTE — Telephone Encounter (Signed)
Requesting a refill on patients prescription    Date of last refill:  03/29/19  Pharmacy verified/updated if needed? yes     Patient is leaving to go out of town today and would like to take it with him

## 2019-04-29 ENCOUNTER — Other Ambulatory Visit (HOSPITAL_BASED_OUTPATIENT_CLINIC_OR_DEPARTMENT_OTHER): Payer: Self-pay | Admitting: Family Medicine

## 2019-04-29 DIAGNOSIS — R3912 Poor urinary stream: Secondary | ICD-10-CM

## 2019-05-01 NOTE — Telephone Encounter (Signed)
Please verify correct pharmacy  Please ensure 90 day supply for mail order pharmacies.  Please verify prescription days equals quantity x refills     LF 01/16/19     BP Readings from Last 1 Encounters:   10/24/18 102/70      Pulse Readings from Last 1 Encounters:   10/24/18 61     Lab Results   Component Value Date    WBC 8.3 11/01/2017    HGB 13.4 11/01/2017    PLT 166 11/01/2017    NA 137 11/01/2017    K 4.8 11/01/2017    GLU 101 (H) 11/01/2017    CR 0.92 11/01/2017    ALT 33 11/01/2017     Recent Visits  Date Type Provider Dept   02/23/19 Office Visit Octavia Bruckner, PA Pc P[facility] [place] recent visits within past 180 days with a meds authorizing provider and meeting all other requirements     Future Appointments  Date Type Provider Dept   05/25/19 Appointment Frazier Richards, Winfred Leeds, PA Pc Pv [place] future appointments within next 180 days with a meds authorizing provider and meeting all other requirements         CURES Audit Trail         User Date Status   [place], LORI  [place], [place], [place], [place], [place], [place] 09/22/2017  5:05 PM  02/03/2018 10:13 AM  06/24/2018  4:40 PM  07/13/2018  3:55 PM  11/22/2018  4:58 PM  01/04/2019 12:00 PM  04/25/2019  6:47 PM Reviewed PDMP [1]  Reviewed PDMP [1]  Reviewed PDMP [1]  Reviewed PDMP [1]  Reviewed PDMP [1]  Reviewed PDMP [1]  Reviewed PDMP [1]             Current Medications  has a current medication list which includes the following prescription(s): acyclovir, hydrocodone 10 mg/acetaminophen 325 mg, lisinopril, tamsulosin, tens unit and electrodes, and testosterone.

## 2019-05-02 ENCOUNTER — Telehealth (HOSPITAL_BASED_OUTPATIENT_CLINIC_OR_DEPARTMENT_OTHER): Payer: Self-pay | Admitting: Medical

## 2019-05-02 DIAGNOSIS — Z20822 Contact with and (suspected) exposure to covid-19: Secondary | ICD-10-CM

## 2019-05-02 DIAGNOSIS — Z20828 Contact with and (suspected) exposure to other viral communicable diseases: Secondary | ICD-10-CM

## 2019-05-02 NOTE — Telephone Encounter (Addendum)
Spoke with Pts wife, relayed message. She explained to me the situation at his work and how they are doing  a personal test within the company, one was an 8 hour rapid test and another has been ordered for Friday as well, those test are for the co worker. Pt was exposed to coworker on Sunday so they want to know how long he should wait till he takes the test we have ordered? Please advise

## 2019-05-02 NOTE — Telephone Encounter (Signed)
Test ordered  Please call patient:

## 2019-05-02 NOTE — Telephone Encounter (Signed)
patient was exposed to covid at work. 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 Patient would like to be tested. Please advice.

## 2019-05-03 NOTE — Telephone Encounter (Signed)
Testing at least 48 hours after exposure.     If exposure on Sunday, then okay to test today

## 2019-05-03 NOTE — Telephone Encounter (Signed)
LVMTCB

## 2019-05-05 ENCOUNTER — Ambulatory Visit: Payer: 59 | Attending: Family Medicine

## 2019-05-05 DIAGNOSIS — Z20828 Contact with and (suspected) exposure to other viral communicable diseases: Secondary | ICD-10-CM

## 2019-05-08 ENCOUNTER — Encounter (HOSPITAL_BASED_OUTPATIENT_CLINIC_OR_DEPARTMENT_OTHER): Payer: Self-pay | Admitting: Medical

## 2019-05-08 NOTE — Telephone Encounter (Signed)
Pt tested on the 10th.

## 2019-05-08 NOTE — Telephone Encounter (Signed)
From: Joe Roberts  To: Joe Bruckner, PA  Sent: 05/08/2019 11:39 AM PDT  Subject: Test Result Question    Hi Joe Roberts it's Joe Roberts.. I was exposed to a coworker with [phone].Marland Kitchen [facility] called in and got a [phone] test sent to [facility] lab for me. I took it on Friday July 10th. I did feel very sick on Thursday Friday Saturday I was exposed at work on monday july 6th.  I sent home half day on the 7th when coworker informed my boss he had been exposed at a family gathering multi family members are testing positive.  any suggestions that I should do?? or take??   I have been home quarantined since the day I was sent home on the 7th of July...  I took the covid19 test on friday the 10th as instructed 5 days after i had the exposure ..the lab said a will be 7 to 10 days for the results... will you be calling me when you received them? Thank you!! I will wait to hear back from you! Joe Roberts

## 2019-05-08 NOTE — Telephone Encounter (Signed)
forward

## 2019-05-08 NOTE — Telephone Encounter (Signed)
Reroute

## 2019-05-10 LAB — LAB MISCELLANEOUS PROCEDURE SENDOUT

## 2019-05-13 ENCOUNTER — Other Ambulatory Visit (HOSPITAL_BASED_OUTPATIENT_CLINIC_OR_DEPARTMENT_OTHER): Payer: Self-pay | Admitting: Medical

## 2019-05-15 NOTE — Telephone Encounter (Signed)
Please verify correct pharmacy  Please ensure 90 day supply for mail order pharmacies.  Please verify prescription days equals quantity x refills      BP Readings from Last 1 Encounters:   10/24/18 102/70      Pulse Readings from Last 1 Encounters:   10/24/18 61     Lab Results   Component Value Date    WBC 8.3 11/01/2017    HGB 13.4 11/01/2017    PLT 166 11/01/2017    NA 137 11/01/2017    K 4.8 11/01/2017    GLU 101 (H) 11/01/2017    CR 0.92 11/01/2017    ALT 33 11/01/2017     Recent Visits  Date Type Provider Dept   02/23/19 Office Visit Octavia Bruckner, PA Pc P[facility] [place] recent visits within past 180 days with a meds authorizing provider and meeting all other requirements     Future Appointments  No visits were found meeting these conditions.   Showing future appointments within next 180 days with a meds authorizing provider and meeting all other requirements         CURES Audit Trail         User Date Status   [place], [place], LORI  [place], [place], [place], [place], [place] 09/22/2017  5:05 PM  02/03/2018 10:13 AM  06/24/2018  4:40 PM  07/13/2018  3:55 PM  11/22/2018  4:58 PM  01/04/2019 12:00 PM  04/25/2019  6:47 PM Reviewed PDMP [1]  Reviewed PDMP [1]  Reviewed PDMP [1]  Reviewed PDMP [1]  Reviewed PDMP [1]  Reviewed PDMP [1]  Reviewed PDMP [1]             Current Medications  has a current medication list which includes the following prescription(s): acyclovir, hydrocodone 10 mg/acetaminophen 325 mg, lisinopril, tamsulosin, tens unit and electrodes, and testosterone.

## 2019-05-22 ENCOUNTER — Ambulatory Visit (HOSPITAL_BASED_OUTPATIENT_CLINIC_OR_DEPARTMENT_OTHER): Payer: Self-pay | Admitting: Medical

## 2019-05-22 VITALS — BP 118/64 | HR 65 | Temp 97.1°F | Wt 193.4 lb

## 2019-05-22 DIAGNOSIS — F1190 Opioid use, unspecified, uncomplicated: Secondary | ICD-10-CM

## 2019-05-22 DIAGNOSIS — M5136 Other intervertebral disc degeneration, lumbar region: Secondary | ICD-10-CM

## 2019-05-22 NOTE — Nursing Note (Signed)
Script to say to the patient "Due to the corona virus, screening guidelines are in effect to protect our community. I would like to ask you a few questions. Regardless of your answers we will be helping you with your medical needs."      Patient Reported Covid Symptoms: Patient Reported Covid Symptoms: None  The following is reflective of current CDC guidelines, other symptoms may be documented in this DotPhrase.      Actions:  1) Fever/Fever Hx / New or Worsening Cough/ Known Exposure within 14 days or 2 or more symptoms: Not allowed in clinic    -Send to local PCP triage and notify the provider who was supposed to see the patient.    2) If no symptoms or only one symptome (not listed in bullet one above): Ok to enter, wearing a mask.      Revised 03/02/19

## 2019-05-22 NOTE — Progress Notes (Signed)
53 year old male presents today for CSV for Norco. He takes it for chronic back and coccyx pain. Recently he has had 3 episodes of a sharp pain when twisting or stretching. It has improved but he was just concerned. He has been working 6-7 days a week and when he does have time off he finds himself drinking more, arguing with his wife and being exhausted. She thinks he's depressed but he states he has never been depressed and only rarely does he feel sad. His wife has counseling services with her health plan and they are looking into it.   He has been off work recently since he was exposed to [place] at work so his wife was also quarantined but they both tested negative and she has returned to work. He will go back next week.    Review of Systems   Constitutional: Negative.    HENT: Negative.    Respiratory: Negative.    Cardiovascular: Negative.    Musculoskeletal: Positive for arthralgias, back pain and myalgias.   Neurological: Negative.  Negative for dizziness, weakness and numbness.   Psychiatric/Behavioral: Negative for dysphoric mood. The patient is not nervous/anxious.      Physical Exam  Vitals signs reviewed.   Constitutional:       Appearance: Normal appearance.   Musculoskeletal:      Comments: There is no tenderness upon palpation of the spine and paraspinals   Neurological:      General: No focal deficit present.      Mental Status: He is alert.   Psychiatric:         Mood and Affect: Mood normal.     1. DDD (degenerative disc disease), lumbar  CSA done, non opioid form, CURES run. OK for #90    2. Chronic narcotic use  Use sparingly. Follow up 3 months

## 2019-05-23 ENCOUNTER — Other Ambulatory Visit (HOSPITAL_BASED_OUTPATIENT_CLINIC_OR_DEPARTMENT_OTHER): Payer: Self-pay | Admitting: Medical

## 2019-05-23 DIAGNOSIS — M533 Sacrococcygeal disorders, not elsewhere classified: Secondary | ICD-10-CM

## 2019-05-23 DIAGNOSIS — M5136 Other intervertebral disc degeneration, lumbar region: Secondary | ICD-10-CM

## 2019-05-23 MED ORDER — HYDROCODONE 10 MG-ACETAMINOPHEN 325 MG TABLET
1.0000 | ORAL_TABLET | Freq: Four times a day (QID) | ORAL | 0 refills | Status: DC | PRN
Start: 2019-05-23 — End: 2019-06-15

## 2019-05-23 NOTE — Telephone Encounter (Signed)
Requesting a refill on patients prescription    Date of last refill:   04/25/19  Pharmacy verified/updated if needed? yes

## 2019-05-23 NOTE — Telephone Encounter (Signed)
Refill Guidance:  Typical Refill: 30 days / 0 refills  Schedule appointment before sending request if last visit over 3 months ago    Please verify correct pharmacy  Please ensure 90 day supply for mail order pharmacies.  Please verify prescription days equals quantity x refills    LF 04/26/19     Naloxone previous prescribed:  no  Last CSA date: 05/23/19  Recent Visits  Date Type Provider Dept   05/22/19 Office Visit Octavia Bruckner, PA Pc [facility] [place]   02/23/19 Office Visit Octavia Bruckner, [place] Pc Pv [place] recent visits within past 180 days with a meds authorizing provider and meeting all other requirements     Future Appointments  Date Type Provider Dept   08/23/19 Appointment Frazier Richards, Winfred Leeds, PA Pc Pv [place] future appointments within next 180 days with a meds authorizing provider and meeting all other requirements         CURES Audit Trail         User Date Status   [place], [place], LORI  [place], [place], [place], [place], [place] 09/22/2017  5:05 PM  02/03/2018 10:13 AM  06/24/2018  4:40 PM  07/13/2018  3:55 PM  11/22/2018  4:58 PM  01/04/2019 12:00 PM  04/25/2019  6:47 PM Reviewed PDMP [1]  Reviewed PDMP [1]  Reviewed PDMP [1]  Reviewed PDMP [1]  Reviewed PDMP [1]  Reviewed PDMP [1]  Reviewed PDMP [1]             Pain Medication       Disp Refills Start End    Hydrocodone 10 mg/Acetaminophen 325 mg (NORCO) Tablet 90 tablet 0 04/26/2019 05/26/2019    Sig - Route: Take 1 tablet by mouth every 6 hours if needed. - ORAL    Class: Pharmacy    Earliest Fill Date: 04/26/2019

## 2019-05-25 ENCOUNTER — Ambulatory Visit (HOSPITAL_BASED_OUTPATIENT_CLINIC_OR_DEPARTMENT_OTHER): Payer: Self-pay | Admitting: Medical

## 2019-06-01 ENCOUNTER — Other Ambulatory Visit (HOSPITAL_BASED_OUTPATIENT_CLINIC_OR_DEPARTMENT_OTHER): Payer: Self-pay | Admitting: Family Medicine

## 2019-06-01 DIAGNOSIS — M5136 Other intervertebral disc degeneration, lumbar region: Secondary | ICD-10-CM

## 2019-06-01 NOTE — Telephone Encounter (Signed)
Please verify correct pharmacy  Please ensure 90 day supply for mail order pharmacies.  Please verify prescription days equals quantity x refills     LF 11/14/18     BP Readings from Last 1 Encounters:   05/22/19 118/64      Pulse Readings from Last 1 Encounters:   05/22/19 65     Lab Results   Component Value Date    WBC 8.3 11/01/2017    HGB 13.4 11/01/2017    PLT 166 11/01/2017    NA 137 11/01/2017    K 4.8 11/01/2017    GLU 101 (H) 11/01/2017    CR 0.92 11/01/2017    ALT 33 11/01/2017     Recent Visits  Date Type Provider Dept   05/22/19 Office Visit Octavia Bruckner, [facility] [place]   02/23/19 Office Visit [facility] Bru[facility]ner, [place] [facility] [place] recent visits within past 180 days with a meds authorizing provider and meeting all other requirements     Future Appointments  Date Type Provider Dept   08/23/19 Appointment Frazier Richards, Winfred Leeds, [facility] [place] future appointments within next 180 days with a meds authorizing provider and meeting all other requirements         [address]         User Date Status   [place], [place], [place], [place], [place], [place], [place], [place] 09/22/2017  5:05 PM  02/03/2018 10:13 AM  06/24/2018  4:40 PM  07/13/2018  3:55 PM  11/22/2018  4:58 PM  01/04/2019 12:00 PM  04/25/2019  6:47 PM Reviewed PDMP [1]  Reviewed PDMP [1]  Reviewed PDMP [1]  Reviewed PDMP [1]  Reviewed PDMP [1]  Reviewed PDMP [1]  Reviewed PDMP [1]             Current Medications  has a current medication list which includes the following prescription(s): acyclovir, hydrocodone 10 mg/acetaminophen 325 mg, lisinopril, sildenafil, tamsulosin, tens unit and electrodes, and testosterone.

## 2019-06-07 ENCOUNTER — Encounter (HOSPITAL_BASED_OUTPATIENT_CLINIC_OR_DEPARTMENT_OTHER): Payer: Self-pay

## 2019-06-07 NOTE — Telephone Encounter (Signed)
From: Dionne Bucy  To: Octavia Bruckner, PA  Sent: 06/07/2019 12:31 PM PDT  Subject: Visit Follow-up Question    2ND part email it was to many words to send all as 1 email.....  =====So if I to come in again. I will have to pay cash. I am praying with you just seeing me and we discussing this all at my recent appointment with you. We can do all threw email and if I do get the leave script. I can pick it up at your office reception area . I will file asap SDI.   I have worked since I was [employer] I have never abused any system and paid into them all my life! This is a huge step asking for a medical leave. After long discussion with my wife and my visit with you Cecille Rubin. I believe it is what best for me and to focus on healing getting my high chronic pain under control and my emotional well being.   Cory Roughen

## 2019-06-07 NOTE — Telephone Encounter (Signed)
From: Dionne Bucy  To: Octavia Bruckner, PA  Sent: 06/07/2019 12:29 PM PDT  Subject: Visit Follow-up Question    Hi Lori,  I am following up with you my apt I know you meet with my wife her apt and went over the future.  I am feeling very depressed loss of appetite, weight loss, all day headaches, higher than my daily chronic pain levels pain has increased. I am sad feeling hopeless no energy , no motivation , sleeping patterns are horrible. All life has put on my plate losing job, the injury's multi surgery's in 3 years. I need ask if you can take me out on a "medical leave for few months starting now 06/10/19---10-10-19" ?   I can work on my mental health, My high physical pain level. Visit new options of work and the process of that and retraining for it and get my emotion physical exhaustion under control. ???  I can pick up a script with my medical leave on it at front desk. my company I gave 25+ years of my life too let me go and canceled my medical insurance. due I had follow threw on a work injury.

## 2019-06-08 NOTE — Telephone Encounter (Signed)
Awaiting him getting a case number

## 2019-06-15 ENCOUNTER — Other Ambulatory Visit (HOSPITAL_BASED_OUTPATIENT_CLINIC_OR_DEPARTMENT_OTHER): Payer: Self-pay | Admitting: Medical

## 2019-06-15 ENCOUNTER — Other Ambulatory Visit (HOSPITAL_BASED_OUTPATIENT_CLINIC_OR_DEPARTMENT_OTHER): Payer: Self-pay | Admitting: Family Medicine

## 2019-06-15 DIAGNOSIS — M533 Sacrococcygeal disorders, not elsewhere classified: Secondary | ICD-10-CM

## 2019-06-15 DIAGNOSIS — M5136 Other intervertebral disc degeneration, lumbar region: Secondary | ICD-10-CM

## 2019-06-15 NOTE — Telephone Encounter (Signed)
Refill Guidance:  Typical Refill: 30 days / 0 refills  Schedule appointment before sending request if last visit over 3 months ago    Please verify correct pharmacy  Please ensure 90 day supply for mail order pharmacies.  Please verify prescription days equals quantity x refills     Date of last refill:   05/23/19   Naloxone previous prescribed:  no  Last CSA date: 05/22/19  Recent Visits  Date Type Provider Dept   05/22/19 Office Visit Octavia Bruckner, [facility] [place]   02/23/19 Office Visit Octavia Bruckner, [place] [facility] [place] recent visits within past 180 days with a meds authorizing provider and meeting all other requirements     Future Appointments  Date Type Provider Dept   08/23/19 Appointment Frazier Richards, Winfred Leeds, [facility] [place] future appointments within next 180 days with a meds authorizing provider and meeting all other requirements         [address] [address]         User Date Status   [place], LORI  [place], [place], [place], [place], [place], [place] 09/22/2017  5:05 PM  02/03/2018 10:13 AM  06/24/2018  4:40 PM  07/13/2018  3:55 PM  11/22/2018  4:58 PM  01/04/2019 12:00 PM  04/25/2019  6:47 PM Reviewed PDMP [1]  Reviewed PDMP [1]  Reviewed PDMP [1]  Reviewed PDMP [1]  Reviewed PDMP [1]  Reviewed PDMP [1]  Reviewed PDMP [1]             Pain Medication       Disp Refills Start End    Hydrocodone 10 mg/Acetaminophen 325 mg (NORCO) Tablet 90 tablet 0 05/23/2019 06/22/2019    Sig - Route: Take 1 tablet by mouth every 6 hours if needed. - ORAL    Class: Pharmacy    Earliest Fill Date: 05/23/2019    Ibuprofen (MOTRIN) 800 mg Tablet 60 tablet 0 06/01/2019 05/26/2020    Sig: TAKE ONE TABLET BY MOUTH EVERY SIX HOURS IF NEEDED WITH FOOD     Class: Pharmacy

## 2019-06-15 NOTE — Telephone Encounter (Signed)
Requesting a refill on patients prescription    Date of last refill:   05/23/19  Pharmacy verified/updated if needed? yes

## 2019-06-16 MED ORDER — HYDROCODONE 10 MG-ACETAMINOPHEN 325 MG TABLET
1.0000 | ORAL_TABLET | Freq: Four times a day (QID) | ORAL | 0 refills | Status: DC | PRN
Start: 2019-06-16 — End: 2019-07-18

## 2019-06-16 NOTE — Telephone Encounter (Signed)
Duplicate request closed.

## 2019-07-18 ENCOUNTER — Encounter (HOSPITAL_BASED_OUTPATIENT_CLINIC_OR_DEPARTMENT_OTHER): Payer: Self-pay | Admitting: Family Medicine

## 2019-07-18 ENCOUNTER — Other Ambulatory Visit (HOSPITAL_BASED_OUTPATIENT_CLINIC_OR_DEPARTMENT_OTHER): Payer: Self-pay | Admitting: Medical

## 2019-07-18 DIAGNOSIS — M5136 Other intervertebral disc degeneration, lumbar region: Secondary | ICD-10-CM

## 2019-07-18 DIAGNOSIS — M533 Sacrococcygeal disorders, not elsewhere classified: Secondary | ICD-10-CM

## 2019-07-18 NOTE — Telephone Encounter (Signed)
LFD: Acyclovir 01/16/19 Norco 06/16/19 LVD: 05/22/19 SW PV

## 2019-07-18 NOTE — Telephone Encounter (Signed)
Refill Guidance: Typical Refill: 30 days / 2 refills; Schedule appt before sending request if last visit over 6 months ago    Please verify correct pharmacy  Please ensure 90 day supply for mail order pharmacies.  Please verify prescription days equals quantity x refills      Recent Visits  Date Type Provider Dept   05/22/19 Office Visit [facility] Bruckner[facility] [place]   02/23/19 Office Visit Octavia Bruckner[facility] [place] [facility] [place] recent visits within past 180 days with a meds authorizing provider and meeting all other requirements     Future Appointments  Date Type Provider Dept   08/23/19 Appointment Frazier Richards, Winfred Leeds, [facility] [place] future appointments within next 180 days with a meds authorizing provider and meeting all other requirements         [address] [address]         User Date Status   [place], [place], LORI  [place], [place], [place], [place], [place] 09/22/2017  5:05 PM  02/03/2018 10:13 AM  06/24/2018  4:40 PM  07/13/2018  3:55 PM  11/22/2018  4:58 PM  01/04/2019 12:00 PM  04/25/2019  6:47 PM Reviewed PDMP [1]  Reviewed PDMP [1]  Reviewed PDMP [1]  Reviewed PDMP [1]  Reviewed PDMP [1]  Reviewed PDMP [1]  Reviewed PDMP [1]             Pain Medication       Disp Refills Start End    Hydrocodone 10 mg/Acetaminophen 325 mg (NORCO) Tablet 90 tablet 0 06/16/2019 07/21/2019    Sig - Route: Take 1 tablet by mouth every 6 hours if needed. - ORAL    Class: Pharmacy    Earliest Fill Date: 06/16/2019    Ibuprofen (MOTRIN) 800 mg Tablet 60 tablet 0 06/01/2019 05/26/2020    Sig: TAKE ONE TABLET BY MOUTH EVERY SIX HOURS IF NEEDED WITH FOOD     Class: Pharmacy

## 2019-07-19 MED ORDER — ACYCLOVIR 400 MG TABLET
400.0000 mg | ORAL_TABLET | Freq: Two times a day (BID) | ORAL | 3 refills | Status: AC
Start: 2019-07-19 — End: 2020-07-18

## 2019-07-19 MED ORDER — HYDROCODONE 10 MG-ACETAMINOPHEN 325 MG TABLET
1.0000 | ORAL_TABLET | Freq: Four times a day (QID) | ORAL | 0 refills | Status: DC | PRN
Start: 2019-07-19 — End: 2019-08-21

## 2019-07-27 ENCOUNTER — Other Ambulatory Visit (HOSPITAL_BASED_OUTPATIENT_CLINIC_OR_DEPARTMENT_OTHER): Payer: Self-pay | Admitting: Medical

## 2019-07-27 DIAGNOSIS — I10 Essential (primary) hypertension: Secondary | ICD-10-CM

## 2019-07-27 NOTE — Telephone Encounter (Signed)
Refill Guidance:  Typical Refill: 90 days / 3 refills  Schedule appointment before sending request if last visit over 6 months ago, or if more than 1 month past due per last progress notes    Please verify correct pharmacy  Please ensure 90 day supply for mail order pharmacies.  Please verify prescription days equals quantity x refills     LF 03/29/19    BP Readings from Last 3 Encounters:   05/22/19 118/64   10/24/18 102/70   07/20/18 132/80     Lab Results   Component Value Date    CR 0.92 11/01/2017    LDLC 169 (H) 09/04/2018      Recent Visits  Date Type Provider Dept   05/22/19 Office Visit Octavia Bruckner, [facility] [place]   02/23/19 Office Visit Octavia Bruckner, [place] [facility] [place] recent visits within past 180 days with a meds authorizing provider and meeting all other requirements     Future Appointments  Date Type Provider Dept   08/23/19 Appointment Frazier Richards, Winfred Leeds, [facility] [place] future appointments within next 180 days with a meds authorizing provider and meeting all other requirements      BP Medications in Epic (for confirmation)  Hypertensive Medication             Lisinopril (PRINIVIL, ZESTRIL) 20 mg Tablet Take 1 tablet by mouth every day.

## 2019-07-28 ENCOUNTER — Other Ambulatory Visit (HOSPITAL_BASED_OUTPATIENT_CLINIC_OR_DEPARTMENT_OTHER): Payer: Self-pay | Admitting: Medical

## 2019-07-28 DIAGNOSIS — Z1211 Encounter for screening for malignant neoplasm of colon: Secondary | ICD-10-CM

## 2019-08-11 ENCOUNTER — Other Ambulatory Visit (HOSPITAL_BASED_OUTPATIENT_CLINIC_OR_DEPARTMENT_OTHER): Payer: Self-pay | Admitting: Medical

## 2019-08-11 ENCOUNTER — Other Ambulatory Visit (HOSPITAL_BASED_OUTPATIENT_CLINIC_OR_DEPARTMENT_OTHER): Payer: Self-pay | Admitting: Family

## 2019-08-11 DIAGNOSIS — M5136 Other intervertebral disc degeneration, lumbar region: Secondary | ICD-10-CM

## 2019-08-11 NOTE — Telephone Encounter (Signed)
Please verify correct pharmacy  Please ensure 90 day supply for mail order pharmacies.  Please verify prescription days equals quantity x refills     LF 06/01/19     BP Readings from Last 1 Encounters:   05/22/19 118/64      Pulse Readings from Last 1 Encounters:   05/22/19 65     Lab Results   Component Value Date    WBC 8.3 11/01/2017    HGB 13.4 11/01/2017    PLT 166 11/01/2017    NA 137 11/01/2017    K 4.8 11/01/2017    GLU 101 (H) 11/01/2017    CR 0.92 11/01/2017    ALT 33 11/01/2017     Recent Visits  Date Type Provider Dept   05/22/19 Office Visit Octavia Bruckner[facility] [place]   02/23/19 Office Visit Octavia Bruckner[facility] [place] [facility] [place] recent visits within past 180 days with a meds authorizing provider and meeting all other requirements     Future Appointments  Date Type Provider Dept   08/23/19 Appointment Frazier Richards, Winfred Leeds, [facility] [place] future appointments within next 180 days with a meds authorizing provider and meeting all other requirements         [address] [address]         User Date Status   [place], LORI  HEUSER, [place], GARY  [place], [place], [place], [place], [place], [place] 09/22/2017  5:05 PM  02/03/2018 10:13 AM  06/24/2018  4:40 PM  07/13/2018  3:55 PM  11/22/2018  4:58 PM  01/04/2019 12:00 PM  04/25/2019  6:47 PM  07/19/2019  7:53 AM Reviewed PDMP [1]  Reviewed PDMP [1]  Reviewed PDMP [1]  Reviewed PDMP [1]  Reviewed PDMP [1]  Reviewed PDMP [1]  Reviewed PDMP [1]  Reviewed PDMP [1]             Current Medications  has a current medication list which includes the following prescription(s): acyclovir, hydrocodone 10 mg/acetaminophen 325 mg, ibuprofen, lisinopril, tamsulosin, tens unit and electrodes, and testosterone.

## 2019-08-11 NOTE — Telephone Encounter (Signed)
Prescription sent

## 2019-08-11 NOTE — Telephone Encounter (Signed)
Please verify correct pharmacy  Please ensure 90 day supply for mail order pharmacies.  Please verify prescription days equals quantity x refills     LF 05/15/19     BP Readings from Last 1 Encounters:   05/22/19 118/64      Pulse Readings from Last 1 Encounters:   05/22/19 65     Lab Results   Component Value Date    WBC 8.3 11/01/2017    HGB 13.4 11/01/2017    PLT 166 11/01/2017    NA 137 11/01/2017    K 4.8 11/01/2017    GLU 101 (H) 11/01/2017    CR 0.92 11/01/2017    ALT 33 11/01/2017     Recent Visits  Date Type Provider Dept   05/22/19 Office Visit Octavia Bruckner, [facility] [place]   02/23/19 Office Visit Octavia Bruckner, [place] [facility] [place] recent visits within past 180 days with a meds authorizing provider and meeting all other requirements     Future Appointments  Date Type Provider Dept   08/23/19 Appointment Frazier Richards, Winfred Leeds, [facility] [place] future appointments within next 180 days with a meds authorizing provider and meeting all other requirements         CURES [address]         User Date Status   [place], LORI  HEUSER, [place], GARY  [place], [place], [place], [place], [place], [place] 09/22/2017  5:05 PM  02/03/2018 10:13 AM  06/24/2018  4:40 PM  07/13/2018  3:55 PM  11/22/2018  4:58 PM  01/04/2019 12:00 PM  04/25/2019  6:47 PM  07/19/2019  7:53 AM Reviewed PDMP [1]  Reviewed PDMP [1]  Reviewed PDMP [1]  Reviewed PDMP [1]  Reviewed PDMP [1]  Reviewed PDMP [1]  Reviewed PDMP [1]  Reviewed PDMP [1]             Current Medications  has a current medication list which includes the following prescription(s): acyclovir, hydrocodone 10 mg/acetaminophen 325 mg, ibuprofen, lisinopril, tamsulosin, tens unit and electrodes, and testosterone.

## 2019-08-21 ENCOUNTER — Other Ambulatory Visit (HOSPITAL_BASED_OUTPATIENT_CLINIC_OR_DEPARTMENT_OTHER): Payer: Self-pay | Admitting: Medical

## 2019-08-21 DIAGNOSIS — M533 Sacrococcygeal disorders, not elsewhere classified: Secondary | ICD-10-CM

## 2019-08-21 DIAGNOSIS — M5136 Other intervertebral disc degeneration, lumbar region: Secondary | ICD-10-CM

## 2019-08-21 MED ORDER — HYDROCODONE 10 MG-ACETAMINOPHEN 325 MG TABLET
1.0000 | ORAL_TABLET | Freq: Four times a day (QID) | ORAL | 0 refills | Status: DC | PRN
Start: 2019-08-21 — End: 2019-10-04

## 2019-08-21 NOTE — Telephone Encounter (Signed)
Refill Guidance:  Typical Refill: 30 days / 0 refills  Schedule appointment before sending request if last visit over 3 months ago    Please verify correct pharmacy  Please ensure 90 day supply for mail order pharmacies.  Please verify prescription days equals quantity x refills     LF 07/19/19     Naloxone previous prescribed:  no  Last CSA date: 05/22/19  Recent Visits  Date Type Provider Dept   05/22/19 Office Visit Octavia Bruckner, [facility] [place]   02/23/19 Office Visit Octavia Bruckner[facility] [place] [facility] [place] recent visits within past 180 days with a meds authorizing provider and meeting all other requirements     Future Appointments  Date Type Provider Dept   09/25/19 Appointment Frazier Richards, Winfred Leeds, [facility] [place] future appointments within next 180 days with a meds authorizing provider and meeting all other requirements         CURES [address]         User Date Status   [place], LORI  HEUSER, [place], GARY  [place], [place], [place], [place], [place], [place] 09/22/2017  5:05 PM  02/03/2018 10:13 AM  06/24/2018  4:40 PM  07/13/2018  3:55 PM  11/22/2018  4:58 PM  01/04/2019 12:00 PM  04/25/2019  6:47 PM  07/19/2019  7:53 AM Reviewed PDMP [1]  Reviewed PDMP [1]  Reviewed PDMP [1]  Reviewed PDMP [1]  Reviewed PDMP [1]  Reviewed PDMP [1]  Reviewed PDMP [1]  Reviewed PDMP [1]             Pain Medication       Disp Refills Start End    Hydrocodone 10 mg/Acetaminophen 325 mg (NORCO) Tablet 90 tablet 0 07/19/2019 08/23/2019    Sig - Route: Take 1 tablet by mouth every 6 hours if needed. - ORAL    Class: Pharmacy    Earliest Fill Date: 07/19/2019    Cosign for Ordering: Accepted by Theodora Blow On, MD on 07/19/2019 10:12 AM    Ibuprofen (MOTRIN) 800 mg Tablet 60 tablet 0 08/11/2019 08/05/2020    Sig - Route: Take 1 tablet by mouth every 8 hours if needed. - ORAL    Class: Pharmacy

## 2019-08-21 NOTE — Telephone Encounter (Signed)
LFD: 07/19/19 LVD: 05/22/19 SF PV

## 2019-08-23 ENCOUNTER — Ambulatory Visit (HOSPITAL_BASED_OUTPATIENT_CLINIC_OR_DEPARTMENT_OTHER): Payer: Self-pay | Admitting: Medical

## 2019-09-25 ENCOUNTER — Ambulatory Visit (HOSPITAL_BASED_OUTPATIENT_CLINIC_OR_DEPARTMENT_OTHER): Payer: Self-pay | Admitting: Medical

## 2019-10-04 ENCOUNTER — Other Ambulatory Visit (HOSPITAL_BASED_OUTPATIENT_CLINIC_OR_DEPARTMENT_OTHER): Payer: Self-pay | Admitting: Medical

## 2019-10-04 DIAGNOSIS — M533 Sacrococcygeal disorders, not elsewhere classified: Secondary | ICD-10-CM

## 2019-10-04 DIAGNOSIS — M5136 Other intervertebral disc degeneration, lumbar region: Secondary | ICD-10-CM

## 2019-10-04 MED ORDER — HYDROCODONE 10 MG-ACETAMINOPHEN 325 MG TABLET
1.0000 | ORAL_TABLET | Freq: Four times a day (QID) | ORAL | 0 refills | Status: AC | PRN
Start: 2019-10-04 — End: 2019-11-08

## 2019-10-04 NOTE — Telephone Encounter (Signed)
Requesting a refill on patients prescription    Date of last refill:   08/21/19  Pharmacy verified/updated if needed? yes

## 2019-10-04 NOTE — Telephone Encounter (Signed)
Refill Guidance:  Typical Refill: 30 days / 0 refills  Schedule appointment before sending request if last visit over 3 months ago    Please verify correct pharmacy  Please ensure 90 day supply for mail order pharmacies.  Please verify prescription days equals quantity x refills     LF 08/21/19     Naloxone previous prescribed:  no  Last CSA date: 05/22/19  Recent Visits  Date Type Provider Dept   05/22/19 Office Visit Daleen Squibb Schledewitz, PA P[facility] [place] recent visits within past 180 days with a meds authorizing provider and meeting all other requirements     Future Appointments  No visits were found meeting these conditions.   Showing future appointments within next 180 days with a meds authorizing provider and meeting all other requirements         CURES [address]         User Date Status   HEUSER, [place], [place], GARY  [place], [place], [place], [place], [place], [place] 09/22/2017  5:05 PM  02/03/2018 10:13 AM  06/24/2018  4:40 PM  07/13/2018  3:55 PM  11/22/2018  4:58 PM  01/04/2019 12:00 PM  04/25/2019  6:47 PM  07/19/2019  7:53 AM Reviewed PDMP [1]  Reviewed PDMP [1]  Reviewed PDMP [1]  Reviewed PDMP [1]  Reviewed PDMP [1]  Reviewed PDMP [1]  Reviewed PDMP [1]  Reviewed PDMP [1]             Pain Medication       Disp Refills Start End    Ibuprofen (MOTRIN) 800 mg Tablet 60 tablet 0 08/11/2019 08/05/2020    Sig - Route: Take 1 tablet by mouth every 8 hours if needed. - ORAL    Class: Pharmacy

## 2019-11-18 ENCOUNTER — Other Ambulatory Visit (HOSPITAL_BASED_OUTPATIENT_CLINIC_OR_DEPARTMENT_OTHER): Payer: Self-pay | Admitting: Medical

## 2019-11-20 NOTE — Telephone Encounter (Signed)
Refill Guidance:  Typical Refill: 90 days / 3 refills  Schedule appointment before sending request if last visit over 6 months ago, or if more than 1 month past due per last progress notes    Please verify correct pharmacy  Please ensure 90 day supply for mail order pharmacies.  Please verify prescription days equals quantity x refills     LF 08/11/19  LOV 05/22/19    BP Readings from Last 3 Encounters:   05/22/19 118/64   10/24/18 102/70   07/20/18 132/80     Lab Results   Component Value Date    CR 0.92 11/01/2017    LDLC 169 (H) 09/04/2018      Recent Visits  No visits were found meeting these conditions.   Showing recent visits within past 180 days with a meds authorizing provider and meeting all other requirements     Future Appointments  No visits were found meeting these conditions.   Showing future appointments within next 180 days with a meds authorizing provider and meeting all other requirements      BP Medications in Epic (for confirmation)  Hypertensive Medication             Lisinopril (PRINIVIL, ZESTRIL) 20 mg Tablet TAKE ONE TABLET BY MOUTH ONE TIME DAILY

## 2019-11-22 ENCOUNTER — Other Ambulatory Visit (HOSPITAL_BASED_OUTPATIENT_CLINIC_OR_DEPARTMENT_OTHER): Payer: Self-pay | Admitting: Family Medicine

## 2020-01-01 ENCOUNTER — Other Ambulatory Visit (HOSPITAL_BASED_OUTPATIENT_CLINIC_OR_DEPARTMENT_OTHER): Payer: Self-pay | Admitting: Medical

## 2020-01-01 DIAGNOSIS — M533 Sacrococcygeal disorders, not elsewhere classified: Secondary | ICD-10-CM

## 2020-01-01 DIAGNOSIS — M5136 Other intervertebral disc degeneration, lumbar region: Secondary | ICD-10-CM

## 2020-01-01 NOTE — Telephone Encounter (Signed)
Please schedule apt, thank you

## 2020-01-01 NOTE — Telephone Encounter (Signed)
Refill Guidance:  Typical Refill: 30 days / 0 refills  Schedule appointment before sending request if last visit over 3 months ago    Please verify correct pharmacy  Please ensure 90 day supply for mail order pharmacies.  Please verify prescription days equals quantity x refills     LF 10/04/19     Naloxone previous prescribed:  no  Last CSA date: 05/22/19  Recent Visits  No visits were found meeting these conditions.   Showing recent visits within past 180 days with a meds authorizing provider and meeting all other requirements     Future Appointments  No visits were found meeting these conditions.   Showing future appointments within next 180 days with a meds authorizing provider and meeting all other requirements         CURES [address]         User Date Status   HEUSER, LORI  HEUSER, LORI  YOUNG, GARY  [place], [place], [place], [place], [place], [place] 09/22/2017  5:05 PM  02/03/2018 10:13 AM  06/24/2018  4:40 PM  07/13/2018  3:55 PM  11/22/2018  4:58 PM  01/04/2019 12:00 PM  04/25/2019  6:47 PM  07/19/2019  7:53 AM Reviewed PDMP [1]  Reviewed PDMP [1]  Reviewed PDMP [1]  Reviewed PDMP [1]  Reviewed PDMP [1]  Reviewed PDMP [1]  Reviewed PDMP [1]  Reviewed PDMP [1]             Pain Medication       Disp Refills Start End    Ibuprofen (MOTRIN) 800 mg Tablet 60 tablet 0 08/11/2019 08/05/2020    Sig - Route: Take 1 tablet by mouth every 8 hours if needed. - ORAL    Class: Pharmacy

## 2020-01-01 NOTE — Telephone Encounter (Signed)
Requesting a refill on patients prescription    Date of last refill:   10/04/19  Pharmacy verified/updated if needed? yes

## 2020-01-01 NOTE — Telephone Encounter (Signed)
Spoke with pt he stated he will call back tomorrow to schedule CSV

## 2020-01-01 NOTE — Telephone Encounter (Signed)
Pt needs to be seen before a refill

## 2020-01-03 ENCOUNTER — Encounter (HOSPITAL_BASED_OUTPATIENT_CLINIC_OR_DEPARTMENT_OTHER): Payer: Self-pay | Admitting: Medical

## 2020-01-03 NOTE — Telephone Encounter (Signed)
From: Orvan July  To: Eliezer Bottom, PA  Sent: 01/03/2020 2:19 PM PST  Subject: Non-urgent Medical Advice Question    Felecia Shelling. on Monday prescription refill sent to your office from   "[facility]  [phone]"  I'm in [place] visiting family. I use them for my joint pain can you approve them to be refilled? Just talk to [facility] they said.Marland Kitchenthey have not been approved. I need to call contact my doctor. They can't . They did their part sent request.   Thank you Baldo Ash

## 2020-01-08 ENCOUNTER — Other Ambulatory Visit (HOSPITAL_BASED_OUTPATIENT_CLINIC_OR_DEPARTMENT_OTHER): Payer: Self-pay | Admitting: Medical

## 2020-01-08 DIAGNOSIS — M5136 Other intervertebral disc degeneration, lumbar region: Secondary | ICD-10-CM

## 2020-01-08 DIAGNOSIS — M5126 Other intervertebral disc displacement, lumbar region: Secondary | ICD-10-CM

## 2020-01-08 MED ORDER — IBUPROFEN 800 MG TABLET
800.0000 mg | ORAL_TABLET | Freq: Three times a day (TID) | ORAL | 0 refills | Status: AC | PRN
Start: 2020-01-08 — End: 2021-01-02

## 2020-01-08 NOTE — Telephone Encounter (Signed)
Please verify correct pharmacy  Please ensure 90 day supply for mail order pharmacies.  Please verify prescription days equals quantity x refills    LOV: 07.27.2020  LRD: 10.16.2020      BP Readings from Last 1 Encounters:   05/22/19 118/64      Pulse Readings from Last 1 Encounters:   05/22/19 65     Lab Results   Component Value Date    WBC 8.3 11/01/2017    HGB 13.4 11/01/2017    PLT 166 11/01/2017    NA 137 11/01/2017    K 4.8 11/01/2017    GLU 101 (H) 11/01/2017    CR 0.92 11/01/2017    ALT 33 11/01/2017     Recent Visits  No visits were found meeting these conditions.   Showing recent visits within past 180 days with a meds authorizing provider and meeting all other requirements     Future Appointments  No visits were found meeting these conditions.   Showing future appointments within next 180 days with a meds authorizing provider and meeting all other requirements         CURES Aud[address]         User Date Status   HEUSER, LORI  HEUSER, LORI  YOUNG, GARY  [place], [place], [place], [place], [place], [place] 09/22/2017  5:05 PM  02/03/2018 10:13 AM  06/24/2018  4:40 PM  07/13/2018  3:55 PM  11/22/2018  4:58 PM  01/04/2019 12:00 PM  04/25/2019  6:47 PM  07/19/2019  7:53 AM Reviewed PDMP [1]  Reviewed PDMP [1]  Reviewed PDMP [1]  Reviewed PDMP [1]  Reviewed PDMP [1]  Reviewed PDMP [1]  Reviewed PDMP [1]  Reviewed PDMP [1]             Current Medications  has a current medication list which includes the following prescription(s): acyclovir, ibuprofen, lisinopril, tamsulosin, tens unit and electrodes, and testosterone.

## 2020-01-08 NOTE — Telephone Encounter (Signed)
LOV: 07.27.2020  LRD: 10.16.2020

## 2020-05-29 ENCOUNTER — Other Ambulatory Visit (HOSPITAL_BASED_OUTPATIENT_CLINIC_OR_DEPARTMENT_OTHER): Payer: Self-pay | Admitting: Medical

## 2020-05-29 DIAGNOSIS — I10 Essential (primary) hypertension: Secondary | ICD-10-CM

## 2020-05-29 NOTE — Telephone Encounter (Signed)
Refill Guidance:  Typical Refill: 90 days / 3 refills  Schedule appointment before sending request if last visit over 6 months ago, or if more than 1 month past due per last progress notes  Please verify correct pharmacy  Please ensure 90 day supply for mail order pharmacies.  Please verify prescription days equals quantity x refills    LF 07/27/19  LOV 05/22/19  BP Readings from Last 3 Encounters:   05/22/19 118/64   10/24/18 102/70   07/20/18 132/80     Lab Results   Component Value Date    CR 0.92 11/01/2017    LDLC 169 (H) 09/04/2018      Recent Visits  No visits were found meeting these conditions.  Showing recent visits within past 180 days with a meds authorizing provider and meeting all other requirements  Future Appointments  No visits were found meeting these conditions.  Showing future appointments within next 180 days with a meds authorizing provider and meeting all other requirements     BP Medications in Epic (for confirmation)  Hypertensive Medication             Lisinopril (PRINIVIL, ZESTRIL) 20 mg Tablet TAKE ONE TABLET BY MOUTH ONE TIME DAILY

## 2020-08-21 ENCOUNTER — Other Ambulatory Visit (HOSPITAL_BASED_OUTPATIENT_CLINIC_OR_DEPARTMENT_OTHER): Payer: Self-pay | Admitting: Internal Medicine

## 2020-08-21 ENCOUNTER — Encounter (HOSPITAL_BASED_OUTPATIENT_CLINIC_OR_DEPARTMENT_OTHER): Payer: Self-pay | Admitting: Internal Medicine

## 2020-08-21 DIAGNOSIS — Z23 Encounter for immunization: Secondary | ICD-10-CM

## 2020-12-09 ENCOUNTER — Other Ambulatory Visit (HOSPITAL_BASED_OUTPATIENT_CLINIC_OR_DEPARTMENT_OTHER): Payer: Self-pay | Admitting: Medical

## 2020-12-09 DIAGNOSIS — Z1211 Encounter for screening for malignant neoplasm of colon: Secondary | ICD-10-CM

## 2023-03-16 IMAGING — MR MRI LUMBAR SPINE WITHOUT CONTRAST
4 series · 48 of 48 positions shown · IV contrast (Off)
Comparison: none

MRI OF THE LUMBAR SPINE WITHOUT CONTRAST
CLINICAL HISTORY: Low back pain, right hip and leg pain.
TECHNIQUE: Multisequential multiplanar imaging was performed of the lumbar spinal region.

[Series 1: z s-c scano · coronal · 6.0mm · 1.17mm/px · 4 of 6 slices shown]
[im 1/6]
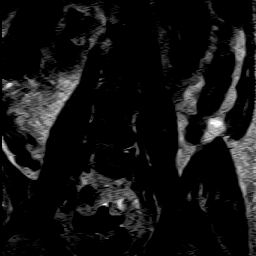
[im 2/6]
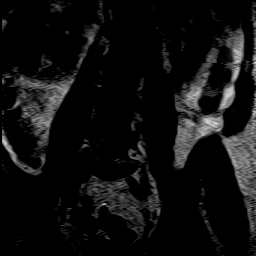
[im 4/6]
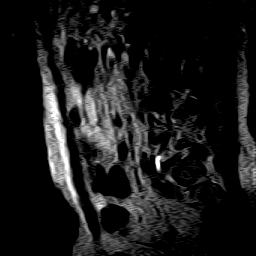
[im 6/6]
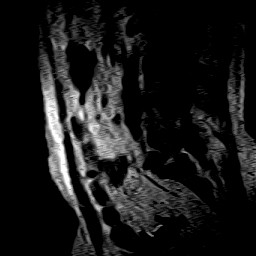

[Series 2: T2 · sagittal · 5.0mm · 1.13mm/px · 11 of 13 slices shown (1 of 2)]
[im 1/13]
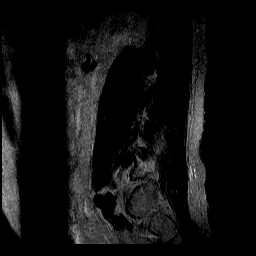
[im 2/13]
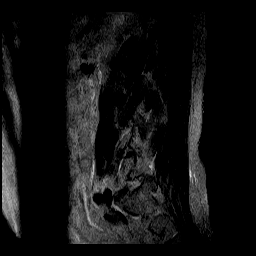
[im 3/13]
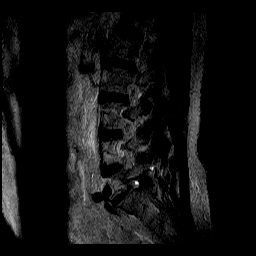
[im 4/13]
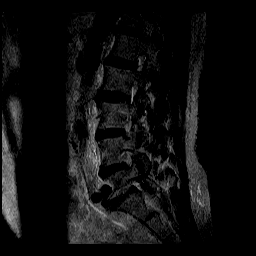
[im 5/13]
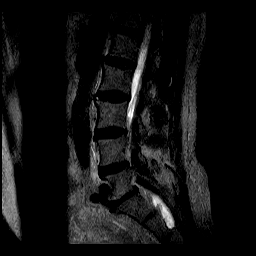
[im 7/13]
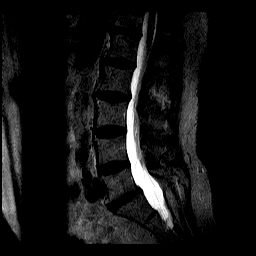
[im 8/13]
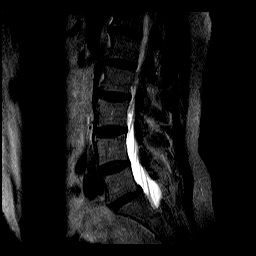
[im 9/13]
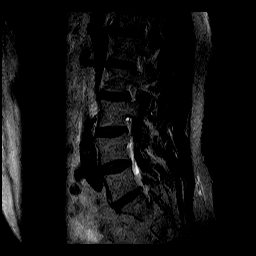
[im 10/13]
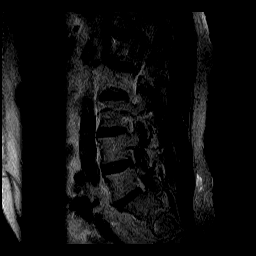
[im 11/13]
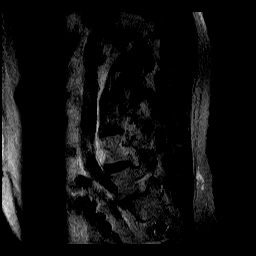
[im 13/13]
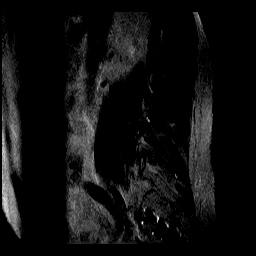

[Series 3: T1 · sagittal · 5.0mm · 1.13mm/px · 11 of 13 slices shown]
[im 1/13]
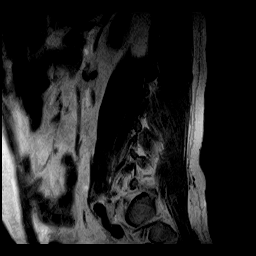
[im 2/13]
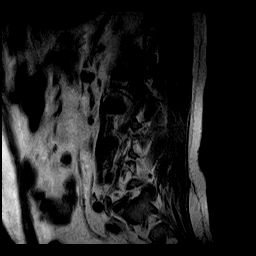
[im 3/13]
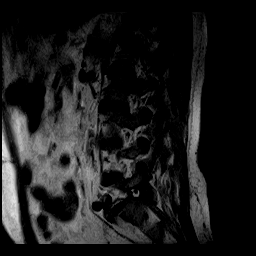
[im 4/13]
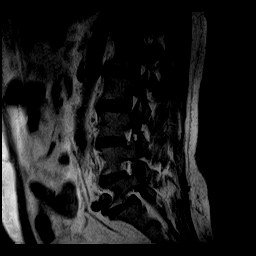
[im 5/13]
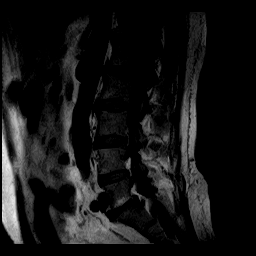
[im 7/13]
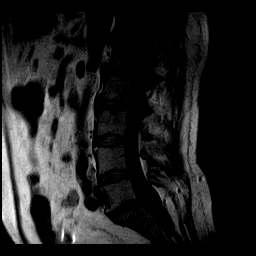
[im 8/13]
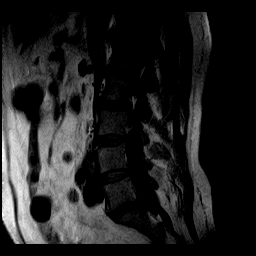
[im 9/13]
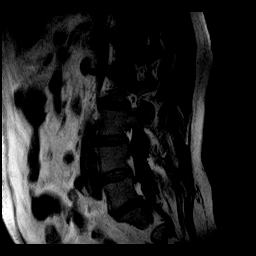
[im 10/13]
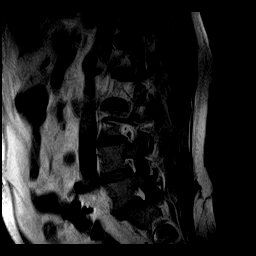
[im 11/13]
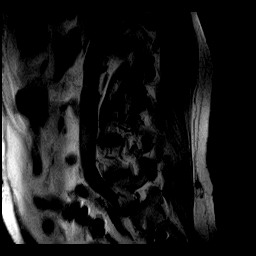
[im 13/13]
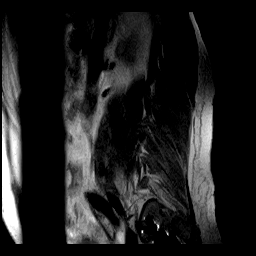

[Series 5: T2 · axial · 4.0mm · 1.17mm/px · z∈[-39,+116]mm · 22 of 27 slices shown (2 of 2)]
[im 1/27]
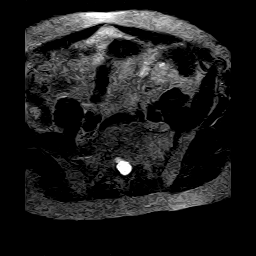
[im 2/27]
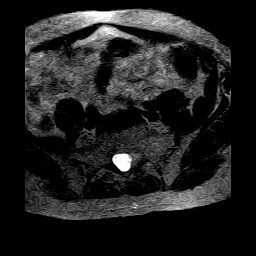
[im 3/27]
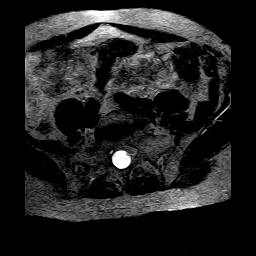
[im 4/27]
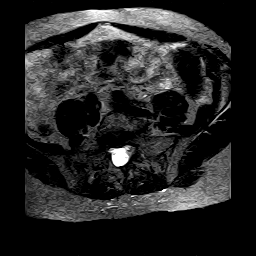
[im 5/27]
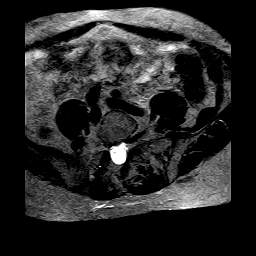
[im 7/27]
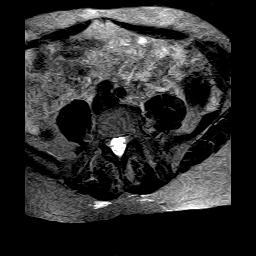
[im 8/27]
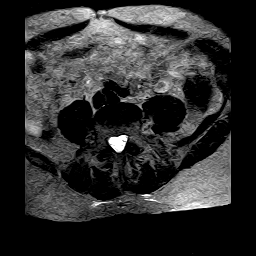
[im 9/27]
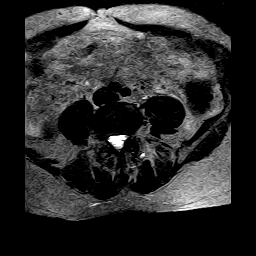
[im 10/27]
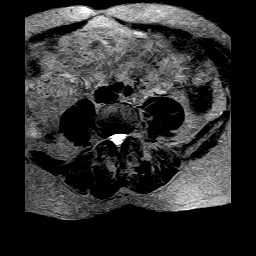
[im 12/27]
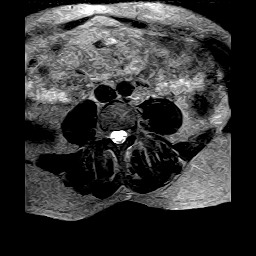
[im 13/27]
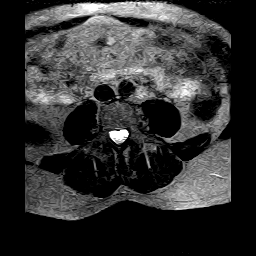
[im 14/27]
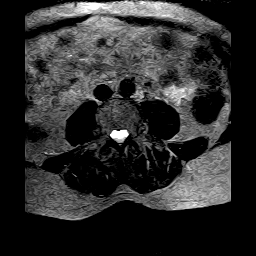
[im 15/27]
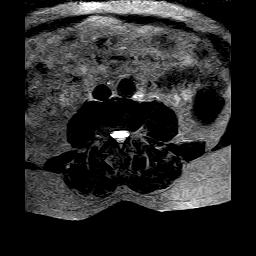
[im 17/27]
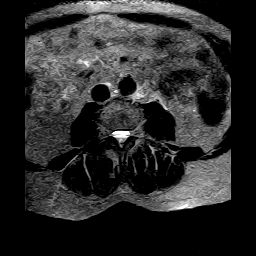
[im 18/27]
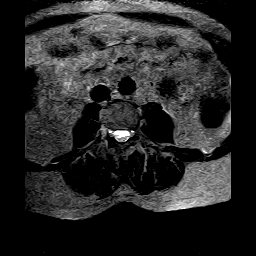
[im 19/27]
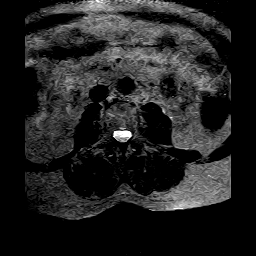
[im 20/27]
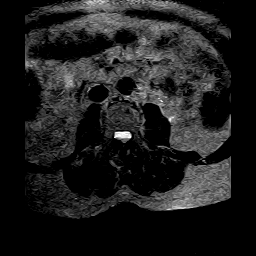
[im 22/27]
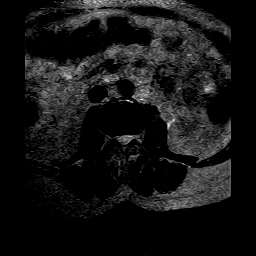
[im 23/27]
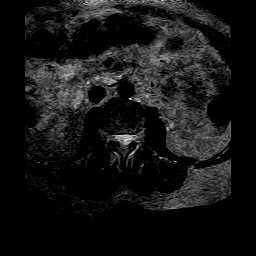
[im 24/27]
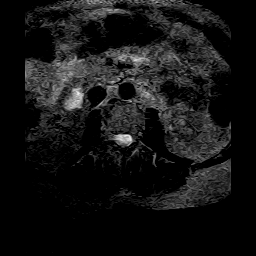
[im 25/27]
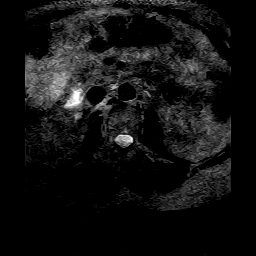
[im 27/27]
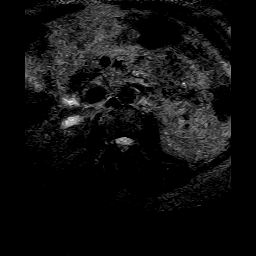

[48 of 48 positions shown; findings below may reference images not displayed]

FINDINGS: There is a normal marrow signal noted throughout the lumbar vertebral bodies. The conus medullaris is unremarkable and there is no obvious intradural abnormality noted. There is moderate facet arthrosis and ligamentum flavum hypertrophy throughout the lumbar spinal region. 

At T12-L1 level, there is anterior, posterior, and lateral osteophyte and 1 mm bulging. 

At L1-L2 level, there is anterior, posterior, and lateral osteophyte, 1 mm retrolisthesis, and 1 mm bulging. 

At L2-L3 level, there is anterior, posterior, and lateral osteophyte and 1 mm bulging. 

At L3-L4 level, there is no evidence of disc herniation, neural foraminal narrowing, or spinal stenosis. 

At L4-L5 level, there is no evidence of disc herniation, neural foraminal narrowing, or spinal stenosis. 

At L5-S1 level, there is anterior, posterior, and lateral osteophyte, 2.5 mm anterolisthesis, and 1.5 to 2 mm bulging. Laminectomy.
IMPRESSION: 1.
Anterior, posterior, and lateral osteophyte and 1 mm bulging at T12-L1, L1-L2, and L2-L3. Anterior, posterior, and lateral osteophyte and 1.5 to 2 mm broad-based bulging at L5-S1. Evidence of previous laminectomy. 

2.
1 mm retrolisthesis of L1-L2 and 2.5 mm anterolisthesis of L5-S1 in neutral position.

## 2023-03-16 IMAGING — MR MRI HIP LT W/O CONTRAST
6 series · 40 of 40 positions shown · IV contrast (Off)
Comparison: none

MRI OF THE LEFT HIP WITHOUT CONTRAST
CLINICAL HISTORY: Bilateral hip pain.
TECHNIQUE: Multisequential multiplanar imaging was performed of the lower pelvis with particular attention to the left hip.

[Series 1: z t/s/c scano · axial · left · 10.0mm · 1.64mm/px · z∈[-21,+210]mm · 4 of 12 slices shown]
[im 1/12]
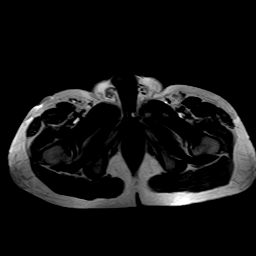
[im 4/12]
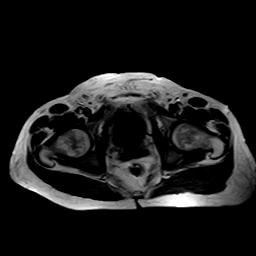
[im 8/12]
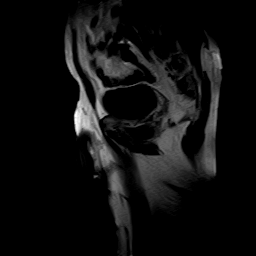
[im 12/12]
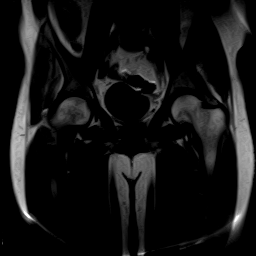

[Series 2: T1 · coronal · left · 5.0mm · 1.64mm/px · 8 of 24 slices shown (1 of 2)]
[im 1/24]
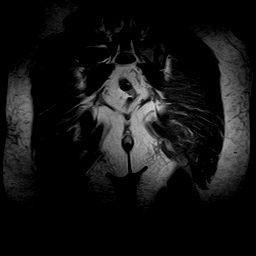
[im 4/24]
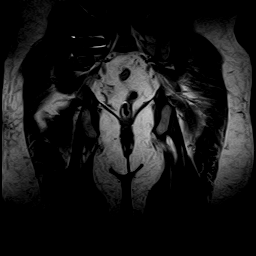
[im 7/24]
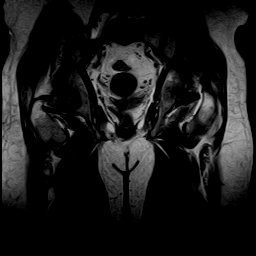
[im 10/24]
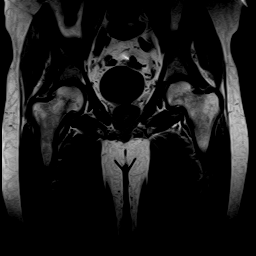
[im 14/24]
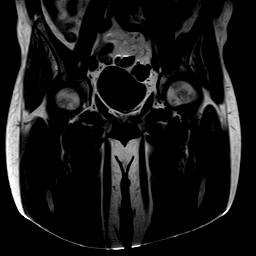
[im 17/24]
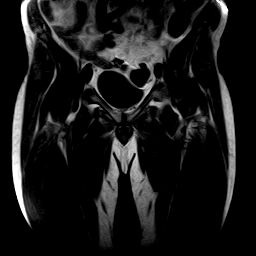
[im 20/24]
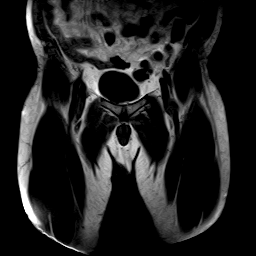
[im 24/24]
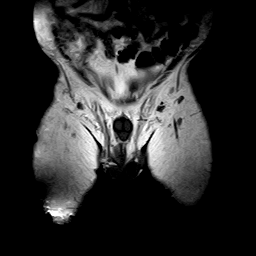

[Series 3: T1 · axial · left · 4.0mm · 1.09mm/px · z∈[-16,+89]mm · 7 of 22 slices shown (2 of 2)]
[im 1/22]
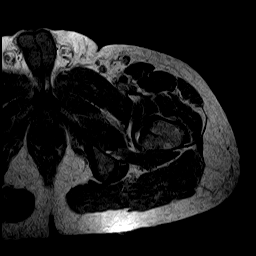
[im 4/22]
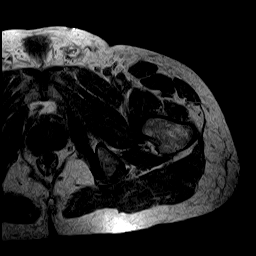
[im 8/22]
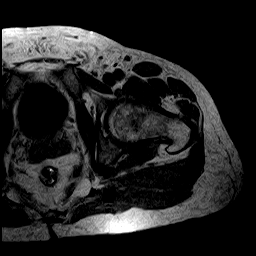
[im 11/22]
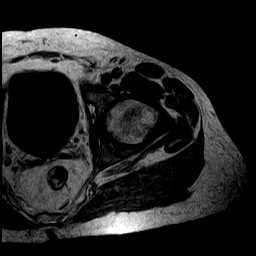
[im 15/22]
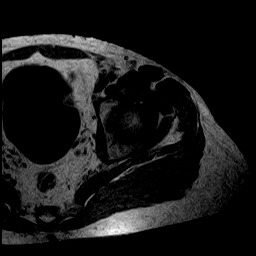
[im 18/22]
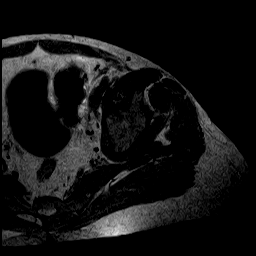
[im 22/22]
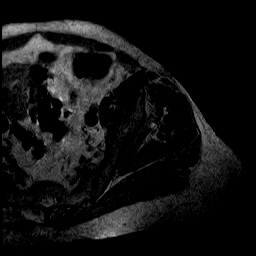

[Series 4: T2 · axial · left · 4.0mm · 1.09mm/px · z∈[-16,+89]mm · 7 of 22 slices shown (1 of 2)]
[im 1/22]
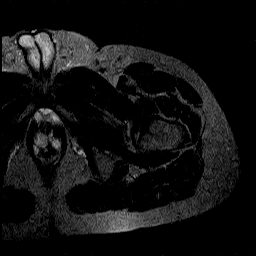
[im 4/22]
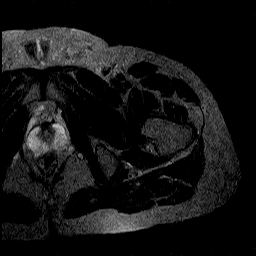
[im 8/22]
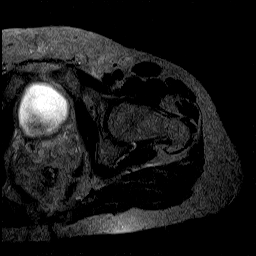
[im 11/22]
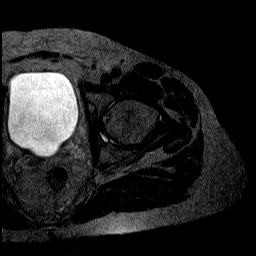
[im 15/22]
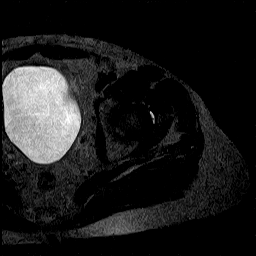
[im 18/22]
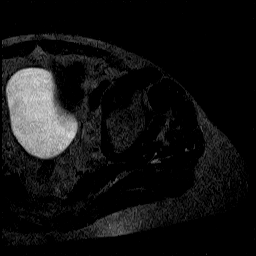
[im 22/22]
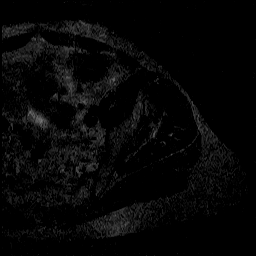

[Series 5: STIR · coronal · left · 5.0mm · 1.09mm/px · 7 of 20 slices shown]
[im 1/20]
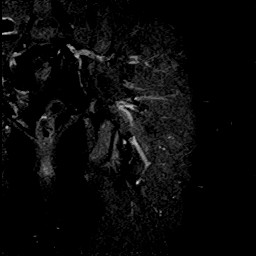
[im 4/20]
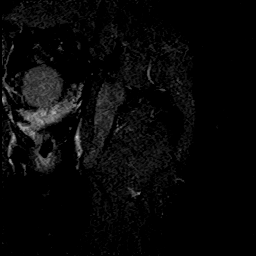
[im 7/20]
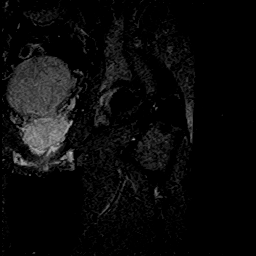
[im 10/20]
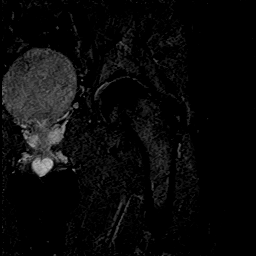
[im 13/20]
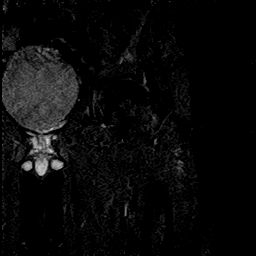
[im 16/20]
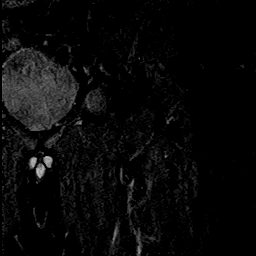
[im 20/20]
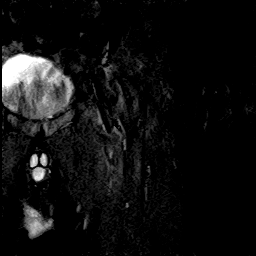

[Series 7: T2 · oblique · left · 4.0mm · 1.09mm/px · 7 of 20 slices shown (2 of 2)]
[im 1/20]
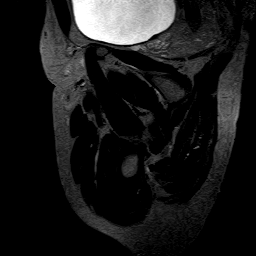
[im 4/20]
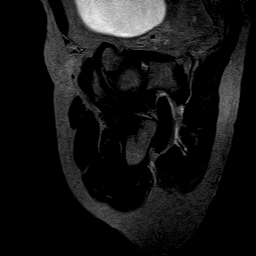
[im 7/20]
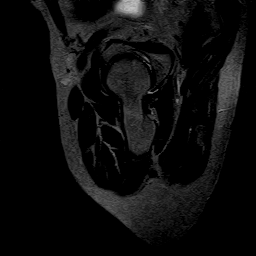
[im 10/20]
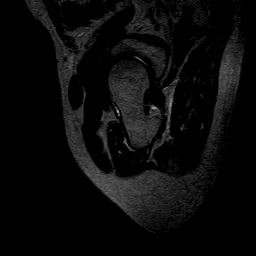
[im 13/20]
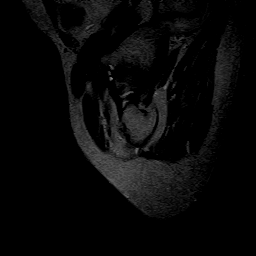
[im 16/20]
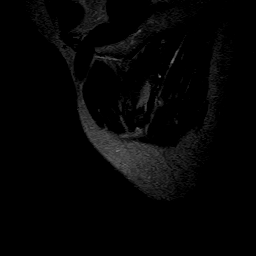
[im 20/20]
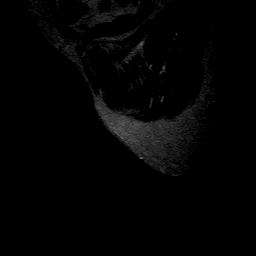

[40 of 40 positions shown; findings below may reference images not displayed]

FINDINGS: There is moderate degenerative change of both hip joints with joint space narrowing, osteophytosis, and subchondral irregularity and erosion of the acetabula. 

Regarding the left hip, there is no soft tissue masses, fluid, or edema identified. There is no abnormal signal in the visualized musculature.  

There is evidence of previous fixation of the right sacroiliac joint.
IMPRESSION: Moderate degenerative change of both hip joints with joint space narrowing, osteophytosis, and subchondral irregularity and erosion of the acetabula bilaterally. No focal soft tissue abnormality associated with the left hip.

## 2023-03-25 IMAGING — MR MRI HIP RT W/O CONTRAST
7 series · 40 of 40 positions shown · IV contrast (Off)
Comparison: none

MRI OF THE RIGHT HIP WITHOUT CONTRAST
CLINICAL HISTORY: Back pain, right hip pain, right leg pain.
TECHNIQUE: Multisequential multiplanar imaging was performed of the lower pelvis with particular attention to the right hip.

[Series 1: z t/s/c scano · axial · right · 10.0mm · 1.64mm/px · z∈[-21,+210]mm · 3 of 12 slices shown]
[im 1/12]
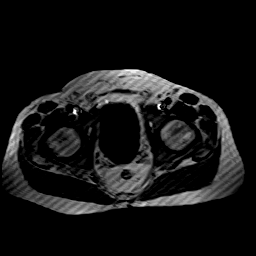
[im 6/12]
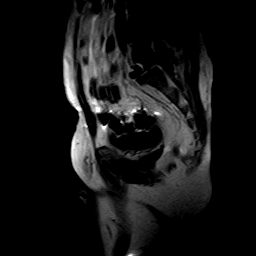
[im 12/12]
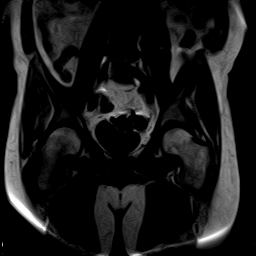

[Series 2: T1 · coronal · right · 4.0mm · 1.56mm/px · 6 of 18 slices shown (1 of 2)]
[im 1/18]
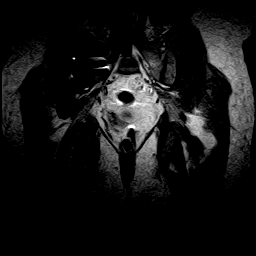
[im 4/18]
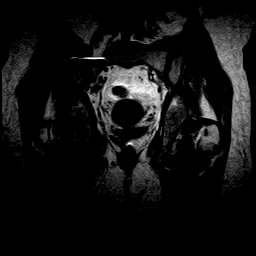
[im 7/18]
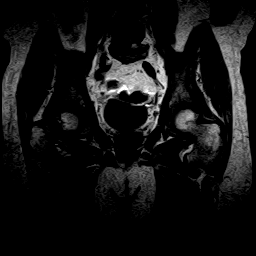
[im 11/18]
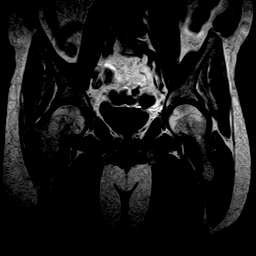
[im 14/18]
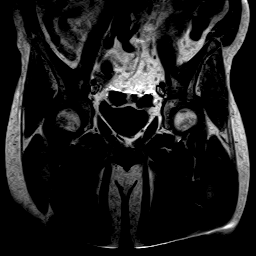
[im 18/18]
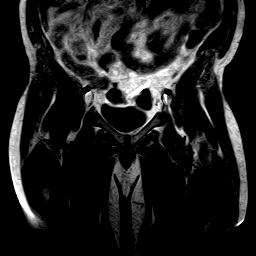

[Series 3: T1 · axial · right · 4.0mm · 1.09mm/px · z∈[-83,+22]mm · 7 of 22 slices shown (2 of 2)]
[im 1/22]
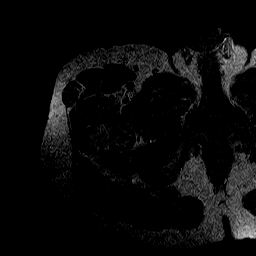
[im 4/22]
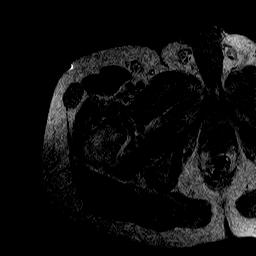
[im 8/22]
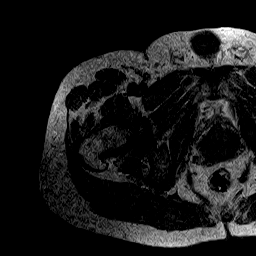
[im 11/22]
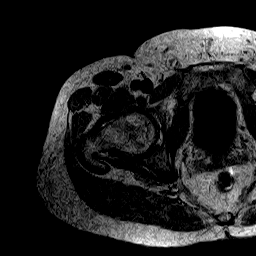
[im 15/22]
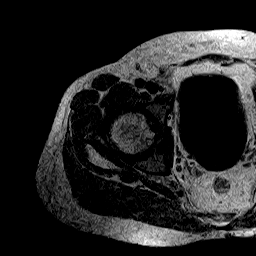
[im 18/22]
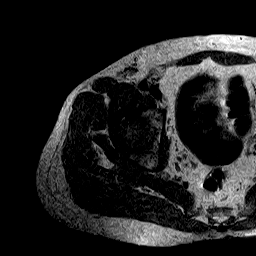
[im 22/22]
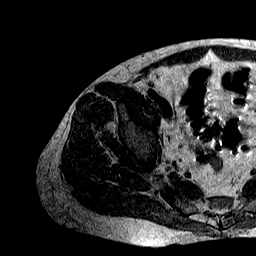

[Series 4: T2 · axial · right · 4.0mm · 1.09mm/px · z∈[-90,+15]mm · 7 of 22 slices shown (1 of 3)]
[im 1/22]
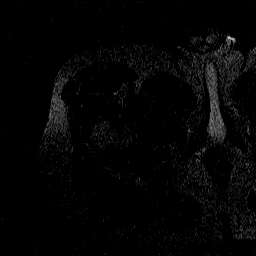
[im 4/22]
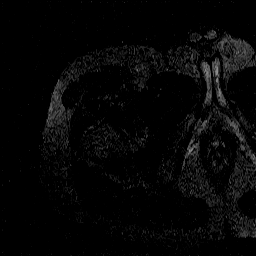
[im 8/22]
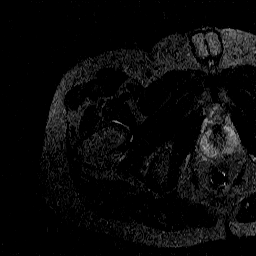
[im 11/22]
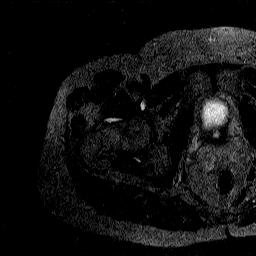
[im 15/22]
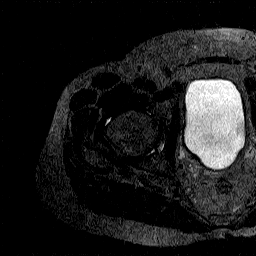
[im 18/22]
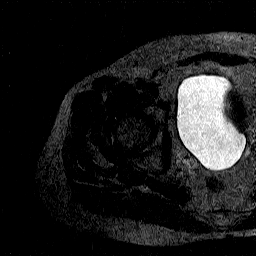
[im 22/22]
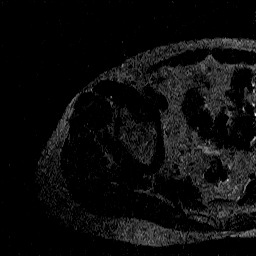

[Series 5: STIR · coronal · right · 5.0mm · 1.09mm/px · 6 of 18 slices shown]
[im 1/18]
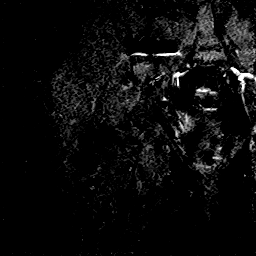
[im 4/18]
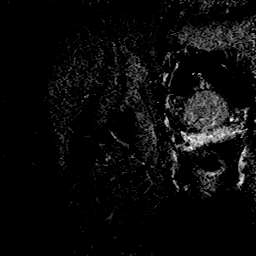
[im 7/18]
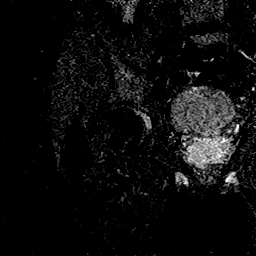
[im 11/18]
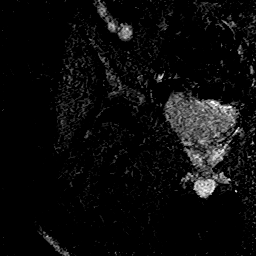
[im 14/18]
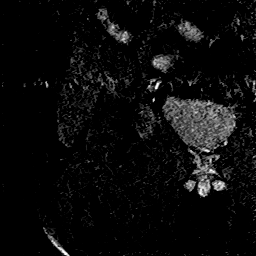
[im 18/18]
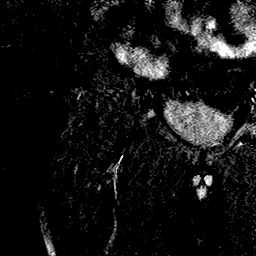

[Series 6: T2 · coronal · right · 4.0mm · 1.09mm/px · 6 of 18 slices shown (2 of 3)]
[im 1/18]
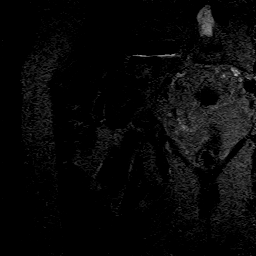
[im 4/18]
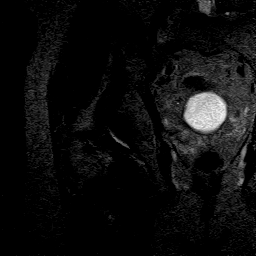
[im 7/18]
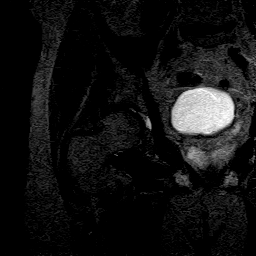
[im 11/18]
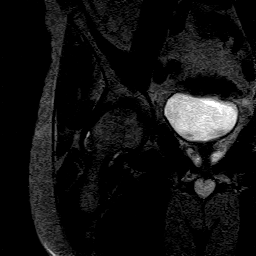
[im 14/18]
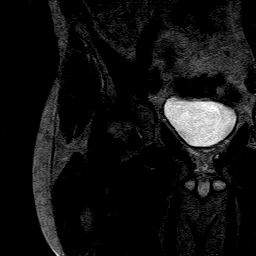
[im 18/18]
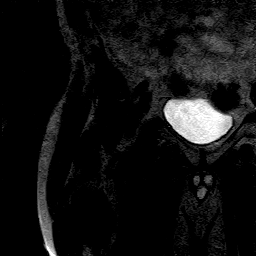

[Series 7: T2 · oblique · right · 4.0mm · 1.09mm/px · 5 of 16 slices shown (3 of 3)]
[im 1/16]
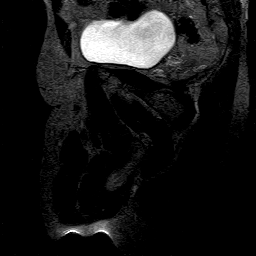
[im 4/16]
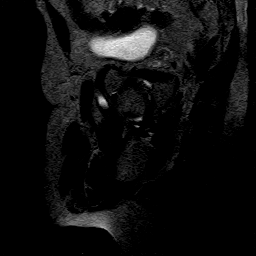
[im 8/16]
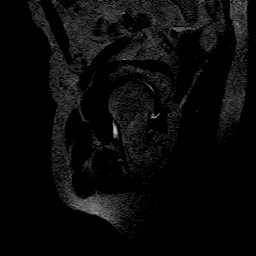
[im 12/16]
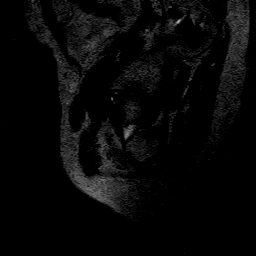
[im 16/16]
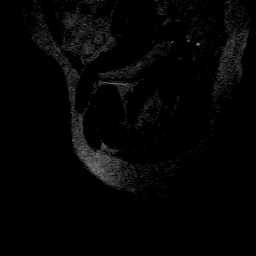

[40 of 40 positions shown; findings below may reference images not displayed]

FINDINGS: There is moderate degenerative change of the right and mild to moderate degenerative change of the left hip joint with some joint space narrowing and osteophytosis. Some subchondral irregularity and erosion of the right hip acetabulum. 

No soft tissue masses, fluid, or edema identified. No abnormal signal in the visualized musculature.  

There is evidence of fixation of the right sacroiliac joint with metal artifact present.
IMPRESSION: 1.
Moderate degenerative change of the right hip joint with some joint space narrowing, osteophytosis, and subchondral irregularity and erosion, especially of the right hip acetabulum. Mild to moderate degenerative change of the left hip joint. 

2.
Regarding the right hip, there is no focal soft tissue abnormality identified.
